# Patient Record
Sex: Female | Born: 1985 | Race: White | Hispanic: No | Marital: Married | Smoking: Never smoker
Health system: Southern US, Community
[De-identification: ages and names within clinical notes are randomized; demographics above are authoritative.]

## PROBLEM LIST (undated history)

## (undated) ENCOUNTER — Inpatient Hospital Stay (HOSPITAL_COMMUNITY): Payer: Self-pay

## (undated) DIAGNOSIS — I10 Essential (primary) hypertension: Secondary | ICD-10-CM

## (undated) DIAGNOSIS — R112 Nausea with vomiting, unspecified: Secondary | ICD-10-CM

## (undated) DIAGNOSIS — K219 Gastro-esophageal reflux disease without esophagitis: Secondary | ICD-10-CM

## (undated) DIAGNOSIS — Z87898 Personal history of other specified conditions: Secondary | ICD-10-CM

## (undated) DIAGNOSIS — Z87442 Personal history of urinary calculi: Secondary | ICD-10-CM

## (undated) DIAGNOSIS — Z8759 Personal history of other complications of pregnancy, childbirth and the puerperium: Secondary | ICD-10-CM

## (undated) DIAGNOSIS — Z9889 Other specified postprocedural states: Secondary | ICD-10-CM

## (undated) DIAGNOSIS — O165 Unspecified maternal hypertension, complicating the puerperium: Secondary | ICD-10-CM

## (undated) DIAGNOSIS — L309 Dermatitis, unspecified: Secondary | ICD-10-CM

## (undated) DIAGNOSIS — R51 Headache: Secondary | ICD-10-CM

## (undated) DIAGNOSIS — Z8659 Personal history of other mental and behavioral disorders: Secondary | ICD-10-CM

## (undated) DIAGNOSIS — R319 Hematuria, unspecified: Secondary | ICD-10-CM

## (undated) DIAGNOSIS — F419 Anxiety disorder, unspecified: Secondary | ICD-10-CM

## (undated) HISTORY — DX: Anxiety disorder, unspecified: F41.9

## (undated) HISTORY — DX: Headache: R51

---
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
## 2005-01-16 ENCOUNTER — Other Ambulatory Visit: Admission: RE | Admit: 2005-01-16 | Discharge: 2005-01-16 | Payer: Self-pay | Admitting: Obstetrics and Gynecology

## 2006-06-01 ENCOUNTER — Ambulatory Visit: Payer: Self-pay

## 2006-06-01 ENCOUNTER — Emergency Department (HOSPITAL_COMMUNITY): Admission: EM | Admit: 2006-06-01 | Discharge: 2006-06-01 | Payer: Self-pay | Admitting: Family Medicine

## 2006-06-01 ENCOUNTER — Encounter: Payer: Self-pay | Admitting: Cardiology

## 2006-06-01 ENCOUNTER — Ambulatory Visit: Payer: Self-pay | Admitting: *Deleted

## 2006-06-16 ENCOUNTER — Ambulatory Visit: Payer: Self-pay | Admitting: Internal Medicine

## 2006-07-27 ENCOUNTER — Ambulatory Visit: Payer: Self-pay | Admitting: Internal Medicine

## 2010-05-11 NOTE — L&D Delivery Note (Signed)
Delivery Note At 6:42 PM a healthy female was delivered via Vaginal, Spontaneous Delivery (Presentation: Right Occiput Anterior).  APGAR: 9, 9; weight 7 lb 0.4 oz (3185 g).   Placenta status: Intact, Spontaneous.  Cord: 3 vessels with the following complications: Cord blood collected for donation  Anesthesia: Epidural  Episiotomy: None Lacerations: 1st degree Suture Repair: 3.0 vicryl rapide Est. Blood Loss (mL): 350cc  Mom to postpartum.  Baby to nursery-stable.  Oliver Pila 02/24/2011, 7:57 PM

## 2010-08-07 LAB — HIV ANTIBODY (ROUTINE TESTING W REFLEX): HIV: NONREACTIVE

## 2010-08-07 LAB — ABO/RH: RH Type: POSITIVE

## 2010-08-07 LAB — HEPATITIS B SURFACE ANTIGEN: Hepatitis B Surface Ag: NEGATIVE

## 2010-09-05 DIAGNOSIS — R3129 Other microscopic hematuria: Secondary | ICD-10-CM

## 2010-09-26 NOTE — Assessment & Plan Note (Signed)
[name]                            CARDIOLOGY OFFICE NOTE   Dawn Meadows                      MRN:          [id number]  DATE:06/16/2006                            DOB:          23-Feb-1986    ALLERGIES:  1. PENICILLIN.  2. CODEINE.   PRESENTING CIRCUMSTANCES:  I am here because I feel my heart racing.   BRIEF HISTORY:  [name] is a 25 year old single female.  She  has no prior cardiac history.  She has been seen in Urgent Care in mid  January for palpitations, and she was referred to the [facility].  She was seen in the office June 01, 2006.  An echocardiogram  was obtained that showed ejection fraction of 55% to 60%.  No left  ventricular wall motion abnormalities.  Trivial mitral regurgitation.  Trivial tricuspid regurgitation.   The patient has been seen by medical practitioners for what are termed  panic attacks, and she has been placed on Effexor for these.  The panic  attacks are characterized by flushing, diaphoresis, nausea, vomiting,  and heart racing.  Taking Effexor has not really affected the frequency  and duration of these attacks.  She also says that she will have heart  racing when she is just sitting watching television.  They last 1 to 2  minutes.  She will wake up with her heart racing, flushed and nauseated.  On the January 22nd visit at [facility] she was given an event  monitor to wear.  Isolated strips have been seen in the office today,  and show only sinus tachycardia.  They are not prolonged enough to show  [place] onset of these dysrhythmias.  So, at this time it possible to  diagnose these episodes of palpitations accompanied by feeling of panic,  diaphoresis, flushing, nausea and vomiting as a dysrhythmia.  Laboratory  studies were also obtained, and show that the thyroid stimulating  hormone level is 0.876.  The free T4 is 1.10.  Both of these are within  normal limits.  Her other  laboratory, which included a basic metabolic  panel, CBC and urinalysis are all within normal limits.  The urinalysis  is negative.   She was questioned quite thoroughly by Dr. Graciela Husbands after orthostatic blood  pressure measurements were taken in the office today.  Over a period of  5 minutes her heart rate from a lying position of 85 beats per minute  had accelerated after standing 5 minutes to 97 beats per minute.  Blood  pressure was essentially unchanged.  Lying it was 125/77, standing it  was 130/95.  Dr. Odessa Fleming line of questioning was then directed at  possible POTS syndrome in this 25 year old female.   The patient denies excessive caffeine, use of drugs or alcohol.  She is  a nonsmoker.  She does at times get dizzy with these spells, and  sometimes feels as if she will pass out if they continue.  From the  studies, it shows that she has orthostatic intolerance.  Upon  questioning, the patient is very sensitive  to heat, often sometimes  running to put her head inside cold refrigerator.  She always has the  air conditioner on in the car with the windows up.  She is definitely  intolerant of heat when others are quite comfortable.  She also feels  her heart racing when she climbs stairs.  She has no prior history of  syncope.  After these episodes, she does feel fatigued.  She is unable  to correlate these with her periods, which she says are heavy on a  monthly regular basis.  Patient is currently on birth control  medication.   There is possibility that this patient is also having a concurrent  history of panic attacks if the diagnosis of postural orthostatic  tachycardia syndrome does not prove to be the pivotal diagnosis, then  more attention could be placed on the panic attack syndrome.   She will be given the monitor to wear, programmed in such a way that it  will capture the whole of an episode including the prodrome so that we  can get an idea whether this is a  dysrhythmia.  Patient has also  mentioned that her urine is fairly densely yellow, which prompted Dr.  Graciela Husbands to recommend that the patient increase fluid intake and to  increase salt intake, as this would be consistent with hypovolemic  state, which would aggravate her POTS.  Dr. Graciela Husbands also forwarded a  series of questions aimed at eliciting whether the patient feels  claustrophobic with these episodes, and the answer seems to be yes.   He brought forth the following foregoing plans:  The patient will wear the event monitor to catch a whole event from  start to finish.  Patient is encouraged to increase fluid intake as well  as salt and electrolyte intake.  There is the possibility that a tilt  table test would be more demonstrative of her symptoms, and this could  be reserved for the future.  The patient will return in 4 weeks for  office visit.  We are awaiting evidence from the newly reprogrammed  monitor, and we are hoping that simple measures such as increasing fluid  and salt will help her symptoms.  Dr. Graciela Husbands has also volunteered to  refer her to Dr. Marinda Elk in [facility], a practitioner who is  skilled at treating panic disorders.  Patient currently takes Effexor  37.5 mg daily and Ortho-Tri-Cyclen birth control on a daily basis.  Once  again, the patient will return in 4 weeks.   Physical exam included an alert and oriented young female, in no acute  distress, answering appropriately.  LUNGS:  Clear to auscultation bilaterally.  HEART:  Is in regular rate and rhythm without murmur.  ABDOMEN:  Soft and non-distended.  Bowel sounds are present.  Abdominal  aorta is not palpated.  Radial pulses are 4/4 bilaterally as are the dorsalis pedis pulses 4/4.   Once again, the plan is for the patient to report back in 4 weeks.  We  will review the monitor results.  We will also reserve tilt table for  more definitive diagnosis if necessary.    Maple Mirza, PA   Electronically Signed      Duke Salvia, MD, [facility]  Electronically Signed   GM/MedQ  DD: 06/16/2006  DT: 06/16/2006  Job #: [id number]

## 2010-09-26 NOTE — Assessment & Plan Note (Signed)
[facility]                            CARDIOLOGY OFFICE NOTE   Dawn Meadows, Dawn Meadows                      MRN:          [id number]  DATE:06/01/2006                            DOB:          [birth year]    CHIEF COMPLAINT:  Palpitations.   HISTORY OF PRESENT ILLNESS:  This is a 25 year old single white female  patient with no prior cardiac history.  She went over to urgent care  today because of palpitations and she was sent here to see [place].  The  patient has a history of panic attack, which she takes Effexor for, but  this has been well controlled, and she says these are very different  from her panic attacks.  She awakened this morning at 4 a.m. with her  heart racing and she became dizzy and nauseated with it.  The total  episode lasted about 2 hours before it would ease, but it continues to  come back.  She has had similar episodes, once while driving down  [address], and she had to pull over and she vomited.  She then  felt better and went on her way.  She can relate 2 other episodes that  she was not quite as sick, and they were shorter lived.  She has no  history of heart murmur, rheumatic heart disease, prior history of  palpitations, chest pain.  She exercises on a regular basis.  She does  not drink excessive caffeine, use or abuse drugs or alcohol, and is a  nonsmoker.  She has no family history of heart disease.   ALLERGIES:  No known drug allergies.   CURRENT MEDICATIONS:  1. Effexor 37.5 mg daily.  2. Ortho Tri-Cyclen birth control that she has taken since she was 25      years old.   PAST MEDICAL HISTORY:  Significant for panic attacks.  No other  significant past medical history.   SOCIAL HISTORY:  She is single.  She works as a Musician.  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  She is a nonsmoker.  Denies drugs or alcohol use or abuse.   FAMILY HISTORY:  Negative for coronary artery disease.   REVIEW  OF SYSTEMS:  Significant for dizziness with her palpitations and  nausea, and occasionally vomiting when she has her palpitations.  No  change in bowels, or melena.  Other review of systems negative.   PHYSICAL EXAM:  This is a young 25 year old white female in no acute  distress.  Blood pressure is 129/93, pulse 88, weight 151.  NECK:  Without JVD, eschar, bruit, or thyroid enlargement.  LUNGS:  Clear anterior, posterior, and lateral.  HEART:  Regular rate and rhythm at 88 beats per minute.  Normal S1, S2.  Question of a mid-systolic click and a 1/6 systolic murmur at the apex.  ABDOMEN:  Soft without organomegaly, masses, lesions, or abnormal  tenderness.  EXTREMITIES:  Without cyanosis, clubbing, or edema.  She has good distal  pulses.   EKG:  Normal sinus rhythm.  Normal EKG.   IMPRESSION:  1. Palpitations with  dizziness, nausea, and occasional vomiting, rule      out arrhythmia.  2. Hypertension in the setting of palpitations.  3. History of panic attacks on Effexor.   PLAN:  At this time, we will check TSH, T4, T3, CMET, CBC, event  recorder, and 2D echo.  She will then followup with Dr. Excell Seltzer next week  to establish with a cardiologist and follow up on these tests.      Jacolyn Reedy, PA-C  Electronically Signed      Cecil Cranker, MD, [facility]  Electronically Signed   ML/MedQ  DD: 06/01/2006  DT: 06/01/2006  Job #: [id number]

## 2010-09-26 NOTE — Assessment & Plan Note (Signed)
[place] [facility]                         ELECTROPHYSIOLOGY OFFICE NOTE   ANNALAURA, SAUSEDA                      MRN:          [id number]  DATE:07/27/2006                            DOB:          April 03, 1986    Drucie Opitz was seen a couple of months ago for symptoms consistent  with __________. We increased her salt and water, her symptoms are much  improved. Her blood pressure was 127/80 today, her pulse was 81,  orthostatics were not taken because her symptoms were so minimal.   I did tell her that I would call Dr. Marinda Elk to try and get her in  with somebody who is good at medical therapy for anxiety, etc., and I  plan to do that.     Duke Salvia, MD, [facility]  Electronically Signed    SCK/MedQ  DD: 07/28/2006  DT: 07/29/2006  Job #: [id number]   cc:   Molly Maduro L. Foy [place], M.D.

## 2010-10-03 ENCOUNTER — Other Ambulatory Visit (HOSPITAL_COMMUNITY): Payer: Self-pay | Admitting: Obstetrics and Gynecology

## 2010-10-03 DIAGNOSIS — Z3689 Encounter for other specified antenatal screening: Secondary | ICD-10-CM

## 2010-10-07 ENCOUNTER — Ambulatory Visit (HOSPITAL_COMMUNITY)
Admission: RE | Admit: 2010-10-07 | Discharge: 2010-10-07 | Disposition: A | Discharge status: Home / Self Care | Payer: BC Managed Care – PPO | Source: Ambulatory Visit | Attending: Obstetrics and Gynecology | Admitting: Obstetrics and Gynecology

## 2010-10-07 DIAGNOSIS — Z3689 Encounter for other specified antenatal screening: Secondary | ICD-10-CM

## 2010-10-07 DIAGNOSIS — O358XX0 Maternal care for other (suspected) fetal abnormality and damage, not applicable or unspecified: Secondary | ICD-10-CM | POA: Insufficient documentation

## 2010-10-07 DIAGNOSIS — Z363 Encounter for antenatal screening for malformations: Secondary | ICD-10-CM | POA: Insufficient documentation

## 2010-10-07 DIAGNOSIS — Z1389 Encounter for screening for other disorder: Secondary | ICD-10-CM | POA: Insufficient documentation

## 2011-02-04 LAB — STREP B DNA PROBE: GBS: NEGATIVE

## 2011-02-17 ENCOUNTER — Inpatient Hospital Stay (HOSPITAL_COMMUNITY): Payer: BC Managed Care – PPO

## 2011-02-17 ENCOUNTER — Encounter (HOSPITAL_COMMUNITY): Payer: Self-pay | Admitting: *Deleted

## 2011-02-17 ENCOUNTER — Inpatient Hospital Stay (HOSPITAL_COMMUNITY)
Admission: AD | Admit: 2011-02-17 | Discharge: 2011-02-17 | Disposition: A | Discharge status: Home / Self Care | Payer: BC Managed Care – PPO | Source: Ambulatory Visit | Attending: Obstetrics and Gynecology | Admitting: Obstetrics and Gynecology

## 2011-02-17 DIAGNOSIS — O479 False labor, unspecified: Secondary | ICD-10-CM | POA: Insufficient documentation

## 2011-02-17 DIAGNOSIS — R3129 Other microscopic hematuria: Secondary | ICD-10-CM

## 2011-02-17 DIAGNOSIS — O4100X0 Oligohydramnios, unspecified trimester, not applicable or unspecified: Secondary | ICD-10-CM | POA: Insufficient documentation

## 2011-02-17 LAB — URINE MICROSCOPIC-ADD ON

## 2011-02-17 LAB — URINALYSIS, ROUTINE W REFLEX MICROSCOPIC
Bilirubin Urine: NEGATIVE
Ketones, ur: 15 mg/dL — AB
Specific Gravity, Urine: 1.025 (ref 1.005–1.030)
Urobilinogen, UA: 0.2 mg/dL (ref 0.0–1.0)

## 2011-02-17 NOTE — Progress Notes (Signed)
Pt G1 at 38.1wks, having contractions and left side back pain that radiates to the front.  Pt reports oligo,.

## 2011-02-17 NOTE — ED Provider Notes (Signed)
Tavi Barnett25 y.o.G1P041w1d Chief Complaint  Patient presents with  . Contractions  . Back Pain    SUBJECTIVE  HPI: Woke today at 0130 with constant sharp left flank pain radiating to left side. Also having mild contractions felt in lower abdomen. 6 days ago had blood-tinged vaginal nucleus and what she thought was a spurt of fluid. The next day she was evaluated for SROM in the office and was felt to be intact but had oligo of about 5. Since then she's had a couple of other small spurts, but thinks this is vaginal discharge. Her ultrasound showed good fetal growth. She's never had similar pain or history of kidney stone. She denies dysuria or gross hematuria. No fever, chills, nausea vomiting.  Past Medical History  Diagnosis Date  . No pertinent past medical history    Ob Hx: Gyn Hx: Past Surgical History  Procedure Date  . Wisdom tooth extraction    History   Social History  . Marital Status: Married    Spouse Name: N/A    Number of Children: N/A  . Years of Education: N/A   Occupational History  . Not on file.   Social History Main Topics  . Smoking status: Never Smoker   . Smokeless tobacco: Not on file  . Alcohol Use: No  . Drug Use: No  . Sexually Active: Yes   Other Topics Concern  . Not on file   Social History Narrative  . No narrative on file   No current facility-administered medications on file prior to encounter.   No current outpatient prescriptions on file prior to encounter.   Allergies  Allergen Reactions  . Codeine Nausea And Vomiting  . Penicillins Nausea And Vomiting    ROS: pertinent items in HPI  OBJECTIVE  BP 136/83  Pulse 111  Temp(Src) 98.3 F (36.8 C) (Oral)  Resp 18  Ht 5\' 6"  (1.676 m)  Wt 87.091 kg (192 lb)  BMI 30.99 kg/m2   Physical Exam:   General: WN/WD sitting up in bed and rubbing left flank. Looks fairly comfortable.  Abd: Soft nontender, term size Cervix: Posterior soft, 1/30/-2 vertex Fetal heart rate  125 to 130s baseline, moderate variability, reactive, isolated mild variable x1 Toco: rare mild UC  Results for orders placed during the hospital encounter of 02/17/11 (from the past 24 hour(s))  URINALYSIS, ROUTINE W REFLEX MICROSCOPIC     Status: Abnormal   Collection Time   02/17/11  7:28 AM      Component Value Range   Color, Urine YELLOW  YELLOW    Appearance CLEAR  CLEAR    Specific Gravity, Urine 1.025  1.005 - 1.030    pH 6.0  5.0 - 8.0    Glucose, UA NEGATIVE  NEGATIVE (mg/dL)   Hgb urine dipstick LARGE (*) NEGATIVE    Bilirubin Urine NEGATIVE  NEGATIVE    Ketones, ur 15 (*) NEGATIVE (mg/dL)   Protein, ur NEGATIVE  NEGATIVE (mg/dL)   Urobilinogen, UA 0.2  0.0 - 1.0 (mg/dL)   Nitrite NEGATIVE  NEGATIVE    Leukocytes, UA NEGATIVE  NEGATIVE   URINE MICROSCOPIC-ADD ON     Status: Normal   Collection Time   02/17/11  7:28 AM      Component Value Range   Squamous Epithelial / LPF RARE  RARE    RBC / HPF TOO NUMEROUS TO COUNT  <3 (RBC/hpf)   AFI 10.9  MAU course: the patient took to regular strength Tylenol as of her  and on MAU and had some relief from her pain. Pre-discharge she is sitting calmly in NAD, smiling. ASSESSMENT  Primigravida at 38 weeks 1 day Fetal parameters are reassuring Hematuria, possible stone Oligohydramnios resolved   PLAN Will send urine for C&S and  Discussed with Dr. Ellyn Hack. Offered patient something stronger than Tylenol for breakthrough pain however she declines. Discussed dosing Tylenol. She will call the office regarding today's prenatal visit and whether she should just keep the Friday prenatal visit with Dr. Senaida Ores.

## 2011-02-18 LAB — URINE CULTURE: Colony Count: NO GROWTH

## 2011-02-20 ENCOUNTER — Telehealth (HOSPITAL_COMMUNITY): Payer: Self-pay | Admitting: *Deleted

## 2011-02-20 ENCOUNTER — Encounter (HOSPITAL_COMMUNITY): Payer: Self-pay | Admitting: *Deleted

## 2011-02-20 NOTE — Telephone Encounter (Signed)
Preadmission screen  

## 2011-02-23 ENCOUNTER — Other Ambulatory Visit: Payer: Self-pay | Admitting: Obstetrics and Gynecology

## 2011-02-23 ENCOUNTER — Encounter (HOSPITAL_COMMUNITY): Payer: Self-pay | Admitting: *Deleted

## 2011-02-24 ENCOUNTER — Encounter (HOSPITAL_COMMUNITY): Payer: Self-pay | Admitting: Anesthesiology

## 2011-02-24 ENCOUNTER — Inpatient Hospital Stay (HOSPITAL_COMMUNITY): Payer: BC Managed Care – PPO | Admitting: Anesthesiology

## 2011-02-24 ENCOUNTER — Inpatient Hospital Stay (HOSPITAL_COMMUNITY)
Admission: RE | Admit: 2011-02-24 | Discharge: 2011-02-26 | DRG: 373 | Disposition: A | Discharge status: Home / Self Care | Payer: BC Managed Care – PPO | Source: Ambulatory Visit | Attending: Obstetrics and Gynecology | Admitting: Obstetrics and Gynecology

## 2011-02-24 ENCOUNTER — Encounter (HOSPITAL_COMMUNITY): Payer: Self-pay

## 2011-02-24 LAB — COMPREHENSIVE METABOLIC PANEL
Albumin: 2.8 g/dL — ABNORMAL LOW (ref 3.5–5.2)
Alkaline Phosphatase: 201 U/L — ABNORMAL HIGH (ref 39–117)
BUN: 11 mg/dL (ref 6–23)
CO2: 24 mEq/L (ref 19–32)
Chloride: 102 mEq/L (ref 96–112)
GFR calc non Af Amer: >90 mL/min (ref >90)
Glucose, Bld: 104 mg/dL — ABNORMAL HIGH (ref 70–99)
Potassium: 3.4 mEq/L — ABNORMAL LOW (ref 3.5–5.1)
Total Bilirubin: 0.3 mg/dL (ref 0.3–1.2)

## 2011-02-24 LAB — CBC
HCT: 31 % — ABNORMAL LOW (ref 36.0–46.0)
Hemoglobin: 10.2 g/dL — ABNORMAL LOW (ref 12.0–15.0)
MCH: 30.5 pg (ref 26.0–34.0)
MCV: 92.8 fL (ref 78.0–100.0)
RBC: 3.34 MIL/uL — ABNORMAL LOW (ref 3.87–5.11)
WBC: 8.3 10*3/uL (ref 4.0–10.5)

## 2011-02-24 LAB — URIC ACID: Uric Acid, Serum: 4.8 mg/dL (ref 2.4–7.0)

## 2011-02-24 LAB — URINALYSIS, ROUTINE W REFLEX MICROSCOPIC
Bilirubin Urine: NEGATIVE
Ketones, ur: 40 mg/dL — AB
Nitrite: NEGATIVE
Protein, ur: NEGATIVE mg/dL
pH: 7 (ref 5.0–8.0)

## 2011-02-24 MED ORDER — PRENATAL PLUS 27-1 MG PO TABS
1.0000 | ORAL_TABLET | Freq: Every day | ORAL | Status: DC
  Administered 2011-02-25 – 2011-02-26 (×2): 1 via ORAL
  Filled 2011-02-24 (×2): qty 1

## 2011-02-24 MED ORDER — DIPHENHYDRAMINE HCL 50 MG/ML IJ SOLN
12.5000 mg | INTRAMUSCULAR | Status: DC | PRN
  Administered 2011-02-24: 12.5 mg via INTRAVENOUS
  Filled 2011-02-24: qty 1

## 2011-02-24 MED ORDER — FENTANYL 2.5 MCG/ML BUPIVACAINE 1/10 % EPIDURAL INFUSION (WH - ANES)
INTRAMUSCULAR | Status: AC
  Administered 2011-02-24: 14 mL/h via EPIDURAL
  Filled 2011-02-24: qty 60

## 2011-02-24 MED ORDER — LACTATED RINGERS IV SOLN
INTRAVENOUS | Status: DC
  Administered 2011-02-24 (×2): via INTRAVENOUS

## 2011-02-24 MED ORDER — ZOLPIDEM TARTRATE 5 MG PO TABS: 5.0000 mg | ORAL_TABLET | Freq: Every evening | ORAL | Status: DC | PRN

## 2011-02-24 MED ORDER — PHENYLEPHRINE 40 MCG/ML (10ML) SYRINGE FOR IV PUSH (FOR BLOOD PRESSURE SUPPORT)
PREFILLED_SYRINGE | INTRAVENOUS | Status: AC
  Filled 2011-02-24: qty 5

## 2011-02-24 MED ORDER — OXYCODONE-ACETAMINOPHEN 5-325 MG PO TABS: 1.0000 | ORAL_TABLET | ORAL | Status: DC | PRN

## 2011-02-24 MED ORDER — OXYCODONE-ACETAMINOPHEN 5-325 MG PO TABS: 2.0000 | ORAL_TABLET | ORAL | Status: DC | PRN

## 2011-02-24 MED ORDER — IBUPROFEN 600 MG PO TABS
600.0000 mg | ORAL_TABLET | Freq: Four times a day (QID) | ORAL | Status: DC | PRN
  Filled 2011-02-24: qty 1

## 2011-02-24 MED ORDER — OXYTOCIN 20 UNITS IN LACTATED RINGERS INFUSION - SIMPLE
1.0000 m[IU]/min | INTRAVENOUS | Status: DC
  Administered 2011-02-24: 2 m[IU]/min via INTRAVENOUS
  Filled 2011-02-24: qty 1000

## 2011-02-24 MED ORDER — FENTANYL 2.5 MCG/ML BUPIVACAINE 1/10 % EPIDURAL INFUSION (WH - ANES)
INTRAMUSCULAR | Status: DC | PRN
  Administered 2011-02-24: 14 mL/h via EPIDURAL
  Administered 2011-02-24: 14 mL/h

## 2011-02-24 MED ORDER — ACETAMINOPHEN 325 MG PO TABS
650.0000 mg | ORAL_TABLET | ORAL | Status: DC | PRN
  Administered 2011-02-24: 650 mg via ORAL
  Filled 2011-02-24: qty 2

## 2011-02-24 MED ORDER — ONDANSETRON HCL 4 MG PO TABS: 4.0000 mg | ORAL_TABLET | ORAL | Status: DC | PRN

## 2011-02-24 MED ORDER — LIDOCAINE HCL (PF) 1 % IJ SOLN
30.0000 mL | INTRAMUSCULAR | Status: DC | PRN
  Filled 2011-02-24 (×2): qty 30

## 2011-02-24 MED ORDER — DIBUCAINE 1 % RE OINT: 1.0000 "application " | TOPICAL_OINTMENT | RECTAL | Status: DC | PRN

## 2011-02-24 MED ORDER — SENNOSIDES-DOCUSATE SODIUM 8.6-50 MG PO TABS: 2.0000 | ORAL_TABLET | Freq: Every day | ORAL | Status: DC

## 2011-02-24 MED ORDER — LANOLIN HYDROUS EX OINT: TOPICAL_OINTMENT | CUTANEOUS | Status: DC | PRN

## 2011-02-24 MED ORDER — BENZOCAINE-MENTHOL 20-0.5 % EX AERO: 1.0000 "application " | INHALATION_SPRAY | CUTANEOUS | Status: DC | PRN

## 2011-02-24 MED ORDER — FLEET ENEMA 7-19 GM/118ML RE ENEM: 1.0000 | ENEMA | RECTAL | Status: DC | PRN

## 2011-02-24 MED ORDER — EPHEDRINE 5 MG/ML INJ
INTRAVENOUS | Status: AC
  Filled 2011-02-24: qty 4

## 2011-02-24 MED ORDER — FENTANYL 2.5 MCG/ML BUPIVACAINE 1/10 % EPIDURAL INFUSION (WH - ANES)
INTRAMUSCULAR | Status: AC
  Filled 2011-02-24: qty 60

## 2011-02-24 MED ORDER — TERBUTALINE SULFATE 1 MG/ML IJ SOLN: 0.2500 mg | Freq: Once | INTRAMUSCULAR | Status: DC | PRN

## 2011-02-24 MED ORDER — WITCH HAZEL-GLYCERIN EX PADS: 1.0000 "application " | MEDICATED_PAD | CUTANEOUS | Status: DC | PRN

## 2011-02-24 MED ORDER — TETANUS-DIPHTH-ACELL PERTUSSIS 5-2.5-18.5 LF-MCG/0.5 IM SUSP
0.5000 mL | Freq: Once | INTRAMUSCULAR | Status: AC
  Administered 2011-02-25: 0.5 mL via INTRAMUSCULAR
  Filled 2011-02-24: qty 0.5

## 2011-02-24 MED ORDER — OXYTOCIN BOLUS FROM INFUSION
500.0000 mL | Freq: Once | INTRAVENOUS | Status: DC
  Filled 2011-02-24: qty 500

## 2011-02-24 MED ORDER — CITRIC ACID-SODIUM CITRATE 334-500 MG/5ML PO SOLN: 30.0000 mL | ORAL | Status: DC | PRN

## 2011-02-24 MED ORDER — LACTATED RINGERS IV SOLN
500.0000 mL | INTRAVENOUS | Status: DC | PRN
  Administered 2011-02-24: 1000 mL via INTRAVENOUS

## 2011-02-24 MED ORDER — OXYTOCIN 20 UNITS IN LACTATED RINGERS INFUSION - SIMPLE
125.0000 mL/h | Freq: Once | INTRAVENOUS | Status: AC
  Administered 2011-02-24: 999 mL/h via INTRAVENOUS

## 2011-02-24 MED ORDER — LIDOCAINE HCL 1.5 % IJ SOLN
INTRAMUSCULAR | Status: DC | PRN
  Administered 2011-02-24 (×2): 5 mL via INTRADERMAL

## 2011-02-24 MED ORDER — ONDANSETRON HCL 4 MG/2ML IJ SOLN: 4.0000 mg | Freq: Four times a day (QID) | INTRAMUSCULAR | Status: DC | PRN

## 2011-02-24 MED ORDER — IBUPROFEN 600 MG PO TABS
600.0000 mg | ORAL_TABLET | Freq: Four times a day (QID) | ORAL | Status: DC
  Administered 2011-02-24 – 2011-02-26 (×6): 600 mg via ORAL
  Filled 2011-02-24 (×6): qty 1

## 2011-02-24 MED ORDER — SIMETHICONE 80 MG PO CHEW
80.0000 mg | CHEWABLE_TABLET | ORAL | Status: DC | PRN
Start: 2011-02-24 — End: 2011-02-26

## 2011-02-24 MED ORDER — DIPHENHYDRAMINE HCL 25 MG PO CAPS: 25.0000 mg | ORAL_CAPSULE | Freq: Four times a day (QID) | ORAL | Status: DC | PRN

## 2011-02-24 MED ORDER — ONDANSETRON HCL 4 MG/2ML IJ SOLN: 4.0000 mg | INTRAMUSCULAR | Status: DC | PRN

## 2011-02-24 NOTE — Progress Notes (Signed)
   Subjective: Comfortable with epidural  Objective: BP 155/100  Pulse 113  Temp(Src) 98.1 F (36.7 C) (Oral)  Resp 20  Ht 5\' 6"  (1.676 m)  Wt 84.823 kg (187 lb)  BMI 30.18 kg/m2  SpO2 100%      FHT:  FHR: 130 bpm, variability: moderate,  accelerations:  Present,  decelerations:  Absent UC:   regular, every 2-3 minutes SVE:  3/70/-1 IUPC placed Labs: Lab Results  Component Value Date   WBC 8.3 02/24/2011   HGB 10.2* 02/24/2011   HCT 31.0* 02/24/2011   MCV 92.8 02/24/2011   PLT 227 02/24/2011    Assessment / Plan: Follow progress  Danta Baumgardner W 02/24/2011, 12:46 PM

## 2011-02-24 NOTE — Anesthesia Preprocedure Evaluation (Signed)
Anesthesia Evaluation  Name, MR# and DOB Patient awake  General Assessment Comment  Reviewed: Allergy & Precautions, H&P , Patient's Chart, lab work & pertinent test results  Airway Mallampati: II TM Distance: >3 FB Neck ROM: full    Dental No notable dental hx.    Pulmonary  clear to auscultation  Pulmonary exam normal       Cardiovascular regular Normal    Neuro/Psych Negative Neurological ROS  Negative Psych ROS   GI/Hepatic negative GI ROS Neg liver ROS    Endo/Other  Negative Endocrine ROS  Renal/GU negative Renal ROS     Musculoskeletal   Abdominal   Peds  Hematology negative hematology ROS (+)   Anesthesia Other Findings   Reproductive/Obstetrics (+) Pregnancy                           Anesthesia Physical Anesthesia Plan  ASA: II  Anesthesia Plan: Epidural   Post-op Pain Management:    Induction:   Airway Management Planned:   Additional Equipment:   Intra-op Plan:   Post-operative Plan:   Informed Consent: I have reviewed the patients History and Physical, chart, labs and discussed the procedure including the risks, benefits and alternatives for the proposed anesthesia with the patient or authorized representative who has indicated his/her understanding and acceptance.     Plan Discussed with:   Anesthesia Plan Comments:         Anesthesia Quick Evaluation  

## 2011-02-24 NOTE — H&P (Signed)
Dawn Meadows is a 25 y.o. female G1P0 at 58 1/7 weeks presenting for induction for elevated BP readings in office of 150-160/90-100.  SHe is also favorable aat 39+ weeks.  Her prenatal care is complicated by the labile blood pressures, but she has had no proteinuria and labs and NST's are WNL.  She has had issues with back and pelvic pain and was thought to have passed a kidney stone last week.  She measured size less than dates and a US showed growth at 34%ile with an AFI of 5+.  Repeat US showed improvement with an AFI of 10.  She currently has no significant symptoms.  History OB History    Grav Para Term Preterm Abortions TAB SAB Ect Mult Living   1 0 0 0 0 0 0 0 0 0      Past Medical History  Diagnosis Date  . No pertinent past medical history   . Anxiety     was on zoloft and effexor til 12/2010   Past Surgical History  Procedure Date  . Wisdom tooth extraction    Family History: family history includes Cancer in her maternal grandmother.  There is no history of Anesthesia problems, and Hypotension, and Malignant hyperthermia, and Pseudochol deficiency, . Social History:  reports that she has never smoked. She has never used smokeless tobacco. She reports that she does not drink alcohol or use illicit drugs.  ROS    Blood pressure 140/92, pulse 116, temperature 98.2 F (36.8 C), temperature source Oral, resp. rate 20, height 5\' 6"  (1.676 m), weight 84.823 kg (187 lb). Maternal Exam:  Uterine Assessment: Contraction frequency is rare.   Abdomen: Patient reports no abdominal tenderness. Fetal presentation: vertex  Introitus: Normal vulva. Normal vagina.    Physical Exam  Constitutional: She appears well-developed.  Cardiovascular: Normal rate and regular rhythm.   Respiratory: Effort normal and breath sounds normal.  GI: Soft. Bowel sounds are normal.  Genitourinary: Vagina normal and uterus normal.  Neurological: She is alert.  Psychiatric: She has a normal mood and  affect.    Prenatal labs: ABO, Rh: O/Positive/-- (03/29 0000) Antibody: Negative (03/29 0000) Rubella: Immune (03/29 0000) RPR: Nonreactive (03/29 0000)  HBsAg: Negative (03/29 0000)  HIV: Non-reactive (03/29 0000)  GBS: Negative (09/26 0000)  First trimester screen negative AFP negative One hour glucola 107  Assessment/Plan: Pt started on pitocin.  Will AROM and follow progress.  WIll order PIH labs and check urine for protein.  Oliver Pila 02/24/2011, 9:06 AM

## 2011-02-24 NOTE — Anesthesia Procedure Notes (Signed)

## 2011-02-24 NOTE — Progress Notes (Signed)
Dawn Meadows is a 25 y.o. G1P0000 at [redacted]Meadows[redacted]d Subjective: Pt feels some pressure with ctx, but comfortable overall, just had cervical exam  Objective: BP 150/84  Pulse 105  Temp(Src) 98.4 F (36.9 C) (Oral)  Resp 20  Ht 5\' 6"  (1.676 m)  Wt 84.823 kg (187 lb)  BMI 30.18 kg/m2  SpO2 100%      FHT:  FHR: 140 bpm, variability: moderate,  accelerations:  Present,  decelerations:  Absent UC:   regular, every 2-3 minutes SVE:   Dilation: 6 Effacement (%): 80 Station: -1 Exam by:: m wilkins rnc  Labs: Lab Results  Component Value Date   WBC 8.3 02/24/2011   HGB 10.2* 02/24/2011   HCT 31.0* 02/24/2011   MCV 92.8 02/24/2011   PLT 227 02/24/2011    Assessment / Plan: Induction of labor due to gestational hypertension,  progressing well on pitocin  Dawn Meadows 02/24/2011, 5:34 PM

## 2011-02-24 NOTE — Progress Notes (Signed)
Dawn Meadows is a 25 y.o. G1P0000 at [redacted]w[redacted]d   Subjective: Pt requested and waiting on epidural  Objective: BP 139/84  Pulse 109  Temp(Src) 98.1 F (36.7 C) (Oral)  Resp 20  Ht 5\' 6"  (1.676 m)  Wt 84.823 kg (187 lb)  BMI 30.18 kg/m2      FHT:  FHR: 140 bpm, variability: moderate,  accelerations:  Present,  decelerations:  Present mild early decels with ctx UC:   regular, every 2 minutes SVE:   Dilation: 2.5 Effacement (%): 50 Station: -2 Exam by:: m wilkins rnc  Labs: Lab Results  Component Value Date   WBC 8.3 02/24/2011   HGB 10.2* 02/24/2011   HCT 31.0* 02/24/2011   MCV 92.8 02/24/2011   PLT 227 02/24/2011    Assessment / Plan: Induction of labor due to gestational hypertension,  progressing well on pitocin BP stable, PIH labs WNL.  Will check urine for protein after epidural, from foley.  Oliver Pila 02/24/2011, 12:26 PM

## 2011-02-24 NOTE — Progress Notes (Signed)
Dawn Meadows is a 25 y.o. G1P0000 at [redacted]w[redacted]d   Subjective: Pt feeling minimal ctx  Objective: BP 140/92  Pulse 116  Temp(Src) 98.2 F (36.8 C) (Oral)  Resp 20  Ht 5\' 6"  (1.676 m)  Wt 84.823 kg (187 lb)  BMI 30.18 kg/m2      FHT:  FHR: 135 bpm, variability: moderate,  accelerations:  Present,  decelerations:  Absent UC:   irregular, every 3-5 minutes SVE:   Dilation: 2.5 Effacement (%): 50 Station: -2 Exam by:: m wilkins rnc AROM clear  Labs: Lab Results  Component Value Date   WBC 8.3 02/24/2011   HGB 10.2* 02/24/2011   HCT 31.0* 02/24/2011   MCV 92.8 02/24/2011   PLT 227 02/24/2011    Assessment / Plan: Doing well, on pitocin.  Epidural as needed.  Dawn Meadows 02/24/2011, 9:24 AM

## 2011-02-25 LAB — CBC
HCT: 29.2 % — ABNORMAL LOW (ref 36.0–46.0)
Hemoglobin: 9.6 g/dL — ABNORMAL LOW (ref 12.0–15.0)
MCHC: 32.9 g/dL (ref 30.0–36.0)
RBC: 3.11 MIL/uL — ABNORMAL LOW (ref 3.87–5.11)
WBC: 13 10*3/uL — ABNORMAL HIGH (ref 4.0–10.5)

## 2011-02-25 NOTE — Progress Notes (Signed)
Post Partum Day 1 Subjective: no complaints, up ad lib and tolerating PO  Objective: Blood pressure 114/78, pulse 81, temperature 98.2 F (36.8 C), temperature source Oral, resp. rate 18, height 5\' 6"  (1.676 m), weight 84.823 kg (187 lb), SpO2 100.00%, unknown if currently breastfeeding.  Physical Exam:  General: alert Lochia: appropriate Uterine Fundus: firm   Basename 02/25/11 0515 02/24/11 0720  HGB 9.6* 10.2*  HCT 29.2* 31.0*    Assessment/Plan: Plan for discharge tomorrow BP WNL since delivery   LOS: 1 day   Benisha Hadaway W 02/25/2011, 8:59 AM

## 2011-02-25 NOTE — Anesthesia Postprocedure Evaluation (Signed)
Anesthesia Post Note  Patient: Dawn Meadows  Procedure(s) Performed: * No procedures listed *  Anesthesia type: Epidural  Patient location: Mother/Baby  Post pain: Pain level controlled  Post assessment: Post-op Vital signs reviewed  Last Vitals:  Filed Vitals:   02/25/11 0624  BP: 114/78  Pulse: 81  Temp: 98.2 F (36.8 C)  Resp: 18    Post vital signs: Reviewed  Level of consciousness: awake  Complications: No apparent anesthesia complications

## 2011-02-26 MED ORDER — IBUPROFEN 600 MG PO TABS: 600.0000 mg | ORAL_TABLET | Freq: Four times a day (QID) | ORAL | Status: AC

## 2011-02-26 NOTE — Progress Notes (Signed)
Post Partum Day 2 Subjective: no complaints, up ad lib and voiding  Objective: Blood pressure 135/86, pulse 64, temperature 97.7 F (36.5 C), temperature source Oral, resp. rate 20, height 5\' 6"  (1.676 m), weight 84.823 kg (187 lb), SpO2 98.00%, unknown if currently breastfeeding.  Physical Exam:  General: alert Lochia: appropriate Uterine Fundus: firm  Basename 02/25/11 0515 02/24/11 0720  HGB 9.6* 10.2*  HCT 29.2* 31.0*    Assessment/Plan: Discharge home Motrin F/u office 6 weeks   LOS: 2 days   Huy Majid W 02/26/2011, 8:52 AM

## 2011-02-26 NOTE — Progress Notes (Signed)
UR Chart review completed.  

## 2011-02-26 NOTE — Discharge Summary (Signed)
Obstetric Discharge Summary Reason for Admission: induction of labor Prenatal Procedures: none Intrapartum Procedures: spontaneous vaginal delivery Postpartum Procedures: none Complications-Operative and Postpartum: first degree perineal laceration Hemoglobin  Date Value Range Status  02/25/2011 9.6* 12.0-15.0 (g/dL) Final     HCT  Date Value Range Status  02/25/2011 29.2* 36.0-46.0 (%) Final    Discharge Diagnoses: Term Pregnancy-delivered  Discharge Information: Date: 02/26/2011 Activity: pelvic rest Diet: routine Medications: Ibuprofen Condition: stable Instructions: refer to practice specific booklet Discharge to: home Follow-up Information    Follow up with Dawn Meadows. Make an appointment in 6 weeks.   Contact information:   [address] [place] [address] [phone]          Newborn Data: Live born female  Birth Weight: 7 lb 0.4 oz (3185 g) APGAR: 9, 9  Home with mother.  Dawn Meadows 02/26/2011, 8:53 AM

## 2012-01-05 IMAGING — US US OB DETAIL+14 WK
1 series · 12 of 28 positions shown · non-contrast
Comparison: none

[Series 1: us ob detail +14 wk · 12 of 64 slices shown]
[im 3/64]
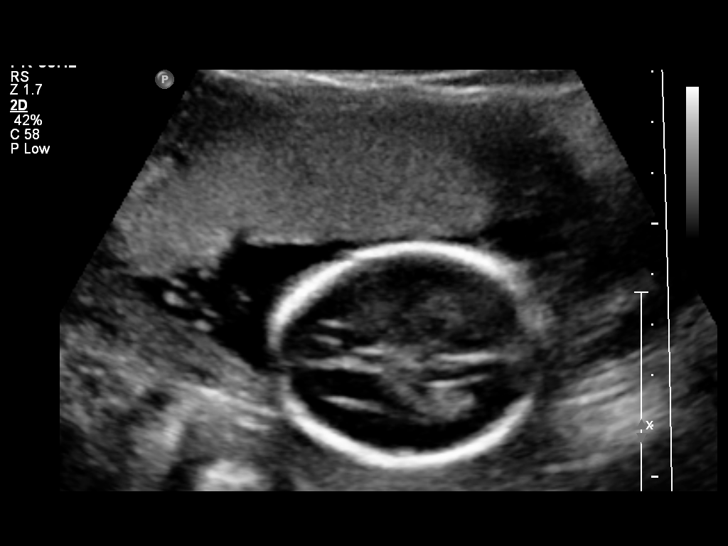
[im 8/64]
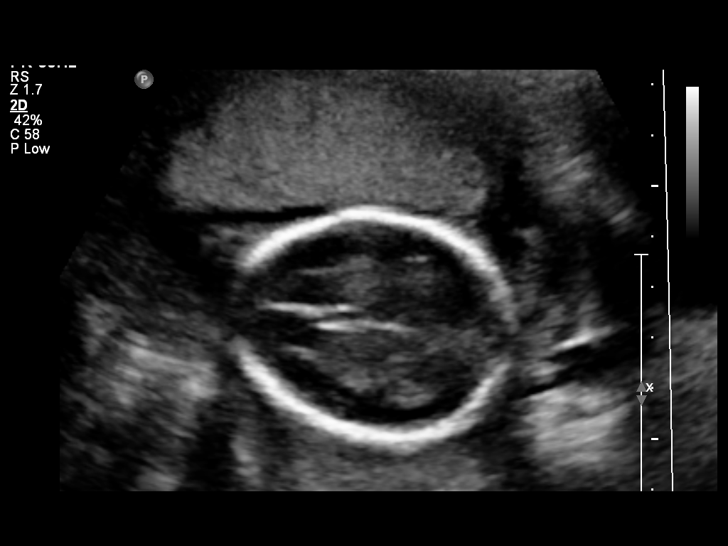
[im 12/64]
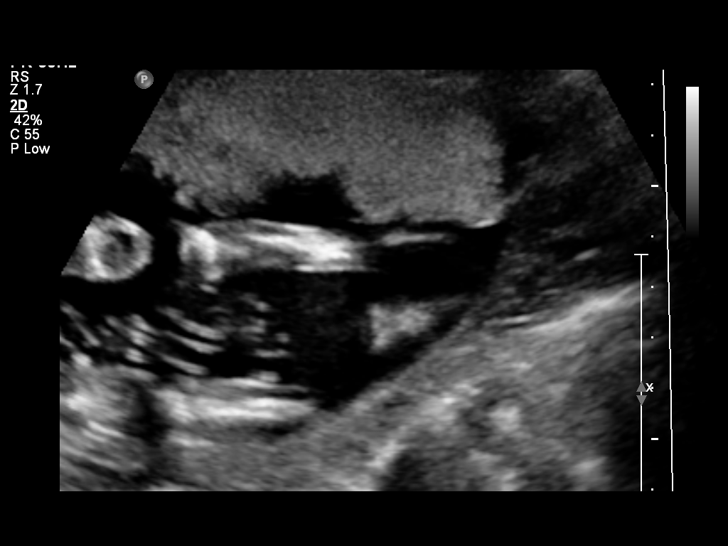
[im 19/64]
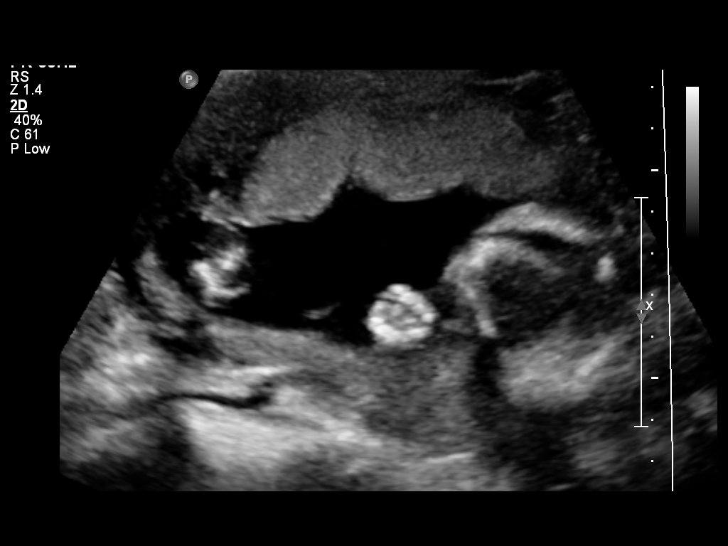
[im 24/64]
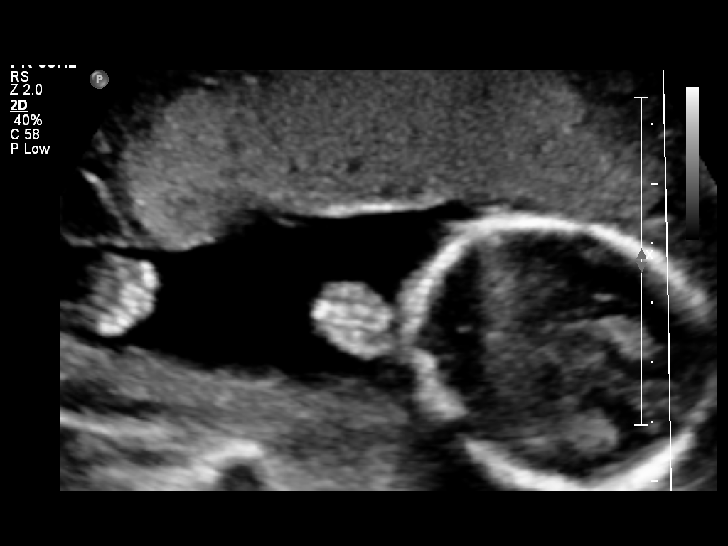
[im 29/64]
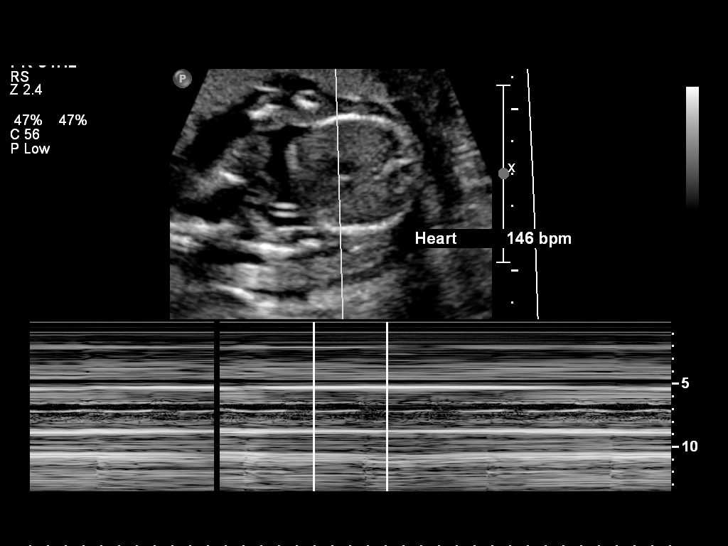
[im 36/64]
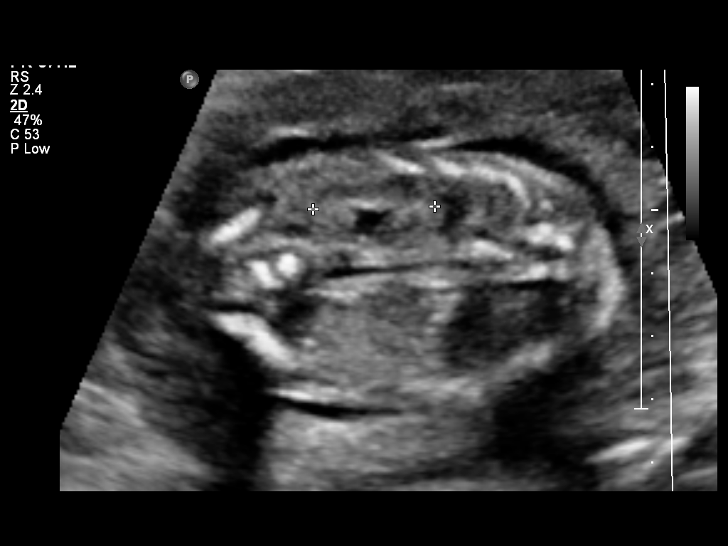
[im 40/64]
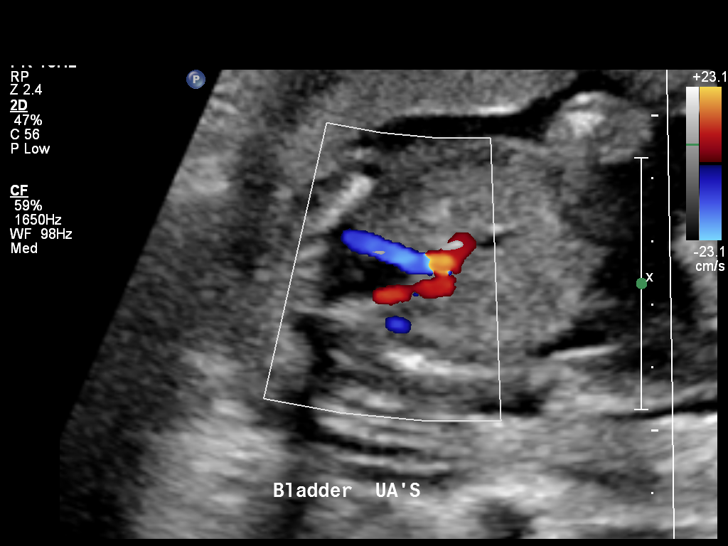
[im 45/64]
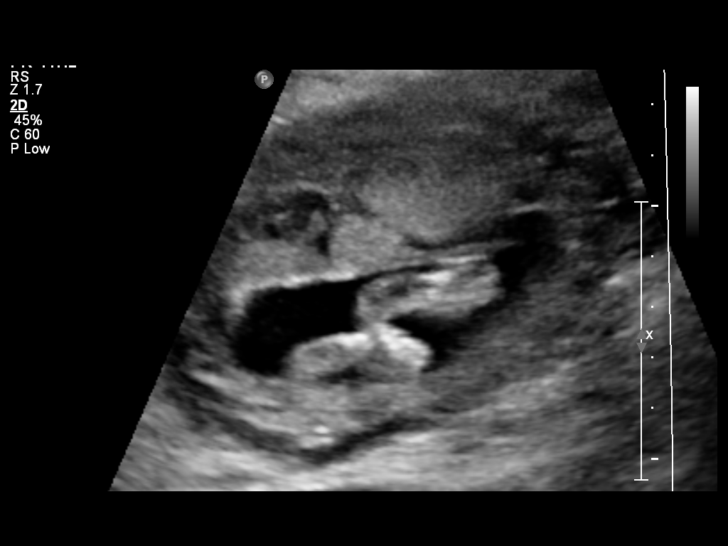
[im 52/64]
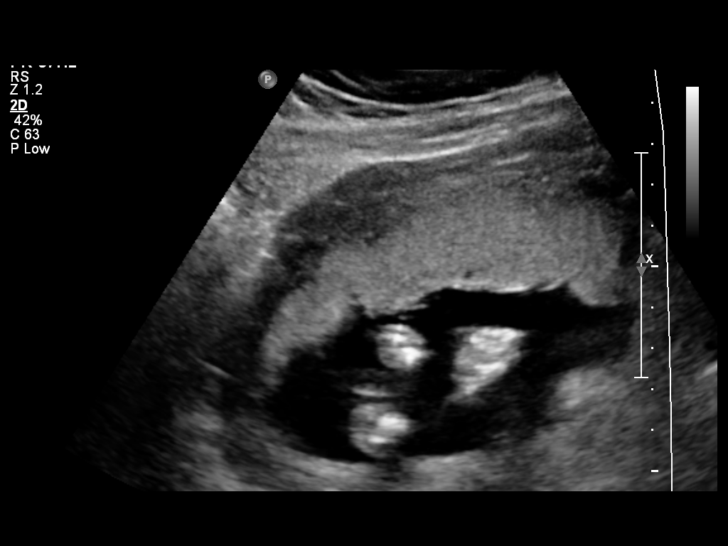
[im 57/64]
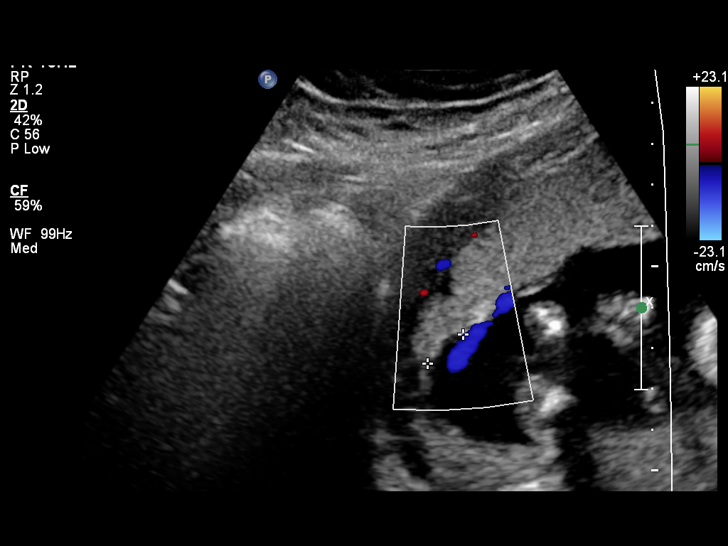
[im 61/64]
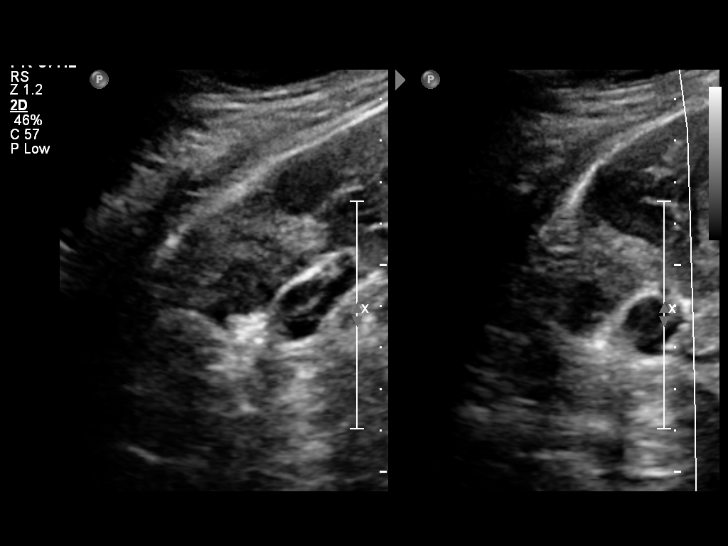

[12 of 28 positions shown; findings below may reference images not displayed]

OBSTETRICS REPORT
                      (Signed Final 10/07/2010 [DATE])

 Order#:         [id number]
Procedures

 US OB DETAIL + 14 WK                                  76811.0
Indications

 Detailed fetal anatomic survey
Fetal Evaluation

 Fetal Heart Rate:  146                          bpm
 Cardiac Activity:  Observed
 Presentation:      Cephalic
 Placenta:          Anterior, above cervical os
 P. Cord            Marginal insertion, 1.1 cm
 Insertion:

 Amniotic Fluid
 AFI FV:      Subjectively within normal limits
                                             Larg Pckt:     3.5  cm
Biometry

 BPD:     43.6  mm     G. Age:  19w 1d                CI:        73.02   70 - 86
                                                      FL/HC:      20.0   16.1 -

 HC:     162.2  mm     G. Age:  19w 0d       36  %
                                                      FL/BPD:
 FL:      32.5  mm     G. Age:  20w 1d       77  %
 HUM:       29  mm     G. Age:  19w 3d       58  %
Gestational Age

 Clinical EDD:  19w 1d                                        EDD:   03/02/11
 U/S Today:     19w 3d                                        EDD:   02/28/11
 Best:          19w 1d     Det. By:  Clinical EDD             EDD:   03/02/11
Anatomy

 Cranium:           Appears normal      Aortic Arch:       Not well
                                                           visualized
 Fetal Cavum:       Appears normal      Ductal Arch:       Not well
                                                           visualized
 Ventricles:        Appears normal      Diaphragm:         Appears normal
 Choroid Plexus:    Appears normal      Stomach:           Appears
                                                           normal, left
                                                           sided
 Cerebellum:        Not well            Abdomen:           Appears normal
                    visualized
 Posterior Fossa:   Not well            Abdominal Wall:    Appears nml
                    visualized                             (cord insert,
                                                           abd wall)
 Nuchal Fold:       Not well            Cord Vessels:      Appears normal
                    visualized                             (3 vessel cord)
 Face:              Appears normal      Kidneys:           Appear normal
                    (lips/profile/orbit
                    s)
 Heart:             Appears normal      Bladder:           Appears normal
                    (4 chamber &
                    axis)
 RVOT:              Not well            Spine:             Not well
                    visualized                             visualized
 LVOT:              Appears normal      Limbs:             Appears normal
                                                           (hands, ankles,
                                                           feet)

 Other:     Fetus appears to be a female. Technically difficult due to
            fetal position.
Cervix Uterus Adnexa

 Cervical Length:    3.7      cm

 Cervix:       Normal appearance by transabdominal scan.
 Uterus:       No abnormality visualized.
 Cul De Sac:   No free fluid seen.

 Left Ovary:    Not visualized.
 Right Ovary:   Within normal limits.
 Adnexa:     No abnormality visualized.
Comments

 Accurate AC not obtained secondary to fetal position.
Impression

   Single living intrauterine gestation with concordant
 gestational age and normal visualized anatomy. The fetal
 spine and posterior fossa are ncompletely seen today, so
 follow up ultrasound in 2 weeks is recommended.  Because
 of incomplete anatomy scan today, modified risk for Trisomy
 21 can be calculated when the patient returns.

  Marginal insertion of umbilical cord into placenta, 1.1 cm
 from edge.

 questions or concerns.

## 2013-05-09 ENCOUNTER — Encounter: Payer: Self-pay | Admitting: *Deleted

## 2013-05-17 ENCOUNTER — Ambulatory Visit: Payer: BC Managed Care – PPO | Admitting: Cardiology

## 2013-05-24 ENCOUNTER — Other Ambulatory Visit: Payer: Self-pay | Admitting: *Deleted

## 2013-05-31 ENCOUNTER — Encounter: Payer: Self-pay | Admitting: *Deleted

## 2013-06-01 ENCOUNTER — Encounter: Payer: Self-pay | Admitting: Cardiology

## 2013-06-01 ENCOUNTER — Ambulatory Visit (INDEPENDENT_AMBULATORY_CARE_PROVIDER_SITE_OTHER): Payer: 59 | Admitting: Cardiology

## 2013-06-01 VITALS — BP 124/87 | HR 88 | Ht 66.0 in | Wt 175.4 lb

## 2013-06-01 DIAGNOSIS — I10 Essential (primary) hypertension: Secondary | ICD-10-CM

## 2013-06-01 DIAGNOSIS — R000 Tachycardia, unspecified: Secondary | ICD-10-CM

## 2013-06-01 LAB — COMPREHENSIVE METABOLIC PANEL
ALT: 14 U/L (ref 0–35)
AST: 16 U/L (ref 0–37)
Albumin: 4.4 g/dL (ref 3.5–5.2)
Alkaline Phosphatase: 54 U/L (ref 39–117)
BUN: 15 mg/dL (ref 6–23)
CO2: 28 mEq/L (ref 19–32)
Calcium: 9.5 mg/dL (ref 8.4–10.5)
Chloride: 102 mEq/L (ref 96–112)
Creatinine, Ser: 0.7 mg/dL (ref 0.4–1.2)
GFR: 108.82 mL/min (ref >60.00)
Glucose, Bld: 90 mg/dL (ref 70–99)
Potassium: 3.4 mEq/L — ABNORMAL LOW (ref 3.5–5.1)
Sodium: 138 mEq/L (ref 135–145)
Total Bilirubin: 0.7 mg/dL (ref 0.3–1.2)
Total Protein: 8.2 g/dL (ref 6.0–8.3)

## 2013-06-01 LAB — CBC WITH DIFFERENTIAL/PLATELET
Basophils Absolute: 0 10*3/uL (ref 0.0–0.1)
Basophils Relative: 0.2 % (ref 0.0–3.0)
Eosinophils Absolute: 0.1 10*3/uL (ref 0.0–0.7)
Eosinophils Relative: 1.3 % (ref 0.0–5.0)
HCT: 39.7 % (ref 36.0–46.0)
Hemoglobin: 13.5 g/dL (ref 12.0–15.0)
Lymphocytes Relative: 34.4 % (ref 12.0–46.0)
Lymphs Abs: 2.9 10*3/uL (ref 0.7–4.0)
MCHC: 34 g/dL (ref 30.0–36.0)
MCV: 89.3 fl (ref 78.0–100.0)
Monocytes Absolute: 0.6 10*3/uL (ref 0.1–1.0)
Monocytes Relative: 6.8 % (ref 3.0–12.0)
Neutro Abs: 4.9 10*3/uL (ref 1.4–7.7)
Neutrophils Relative %: 57.3 % (ref 43.0–77.0)
Platelets: 284 10*3/uL (ref 150.0–400.0)
RBC: 4.44 Mil/uL (ref 3.87–5.11)
RDW: 12.8 % (ref 11.5–14.6)
WBC: 8.5 10*3/uL (ref 4.5–10.5)

## 2013-06-01 LAB — TSH: TSH: 0.5 u[IU]/mL (ref 0.35–5.50)

## 2013-06-01 MED ORDER — DILTIAZEM HCL ER COATED BEADS 120 MG PO CP24: 120.0000 mg | ORAL_CAPSULE | Freq: Every day | ORAL | Status: DC

## 2013-06-01 NOTE — Patient Instructions (Signed)
**Note De-Identified Hulen Mandler Obfuscation** Your physician has requested that you have an echocardiogram. Echocardiography is a painless test that uses sound waves to create images of your heart. It provides your doctor with information about the size and shape of your heart and how well your heart's chambers and valves are working. This procedure takes approximately one hour. There are no restrictions for this procedure.  Your physician has recommended that you wear a 48 hour holter monitor. Holter monitors are medical devices that record the heart's electrical activity. Doctors most often use these monitors to diagnose arrhythmias. Arrhythmias are problems with the speed or rhythm of the heartbeat. The monitor is a small, portable device. You can wear one while you do your normal daily activities. This is usually used to diagnose what is causing palpitations/syncope (passing out).  Your physician recommends that you return for lab work in: today  Your physician recommends that you schedule a follow-up appointment in: after test

## 2013-06-01 NOTE — Progress Notes (Signed)
Patient ID: Dawn Meadows, female   DOB: Jan 03, 1986, 28 y.o.   MRN: [id number]     Patient Name: Dawn Meadows Date of Encounter: 06/01/2013  Primary Care Provider:  Coletta Memos, PA-C Primary Cardiologist:  Dawn Meadows, H  Problem List   Past Medical History  Diagnosis Date  . Anxiety     was on zoloft and effexor til 12/2010  . Gestational hypertension   . Headache(784.0)   . Other malaise and fatigue   . Panic attacks    Past Surgical History  Procedure Laterality Date  . Wisdom tooth extraction    . Vaginal delivery      Allergies  Allergies  Allergen Reactions  . Codeine Nausea And Vomiting  . Penicillins Nausea And Vomiting    HPI  A very pleasant 28 year old female who was diagnosed with a station of hypertension during her pregnancy 2 years ago. The patient states that she wasn't on blood pressure medication during her pregnancy and eventually she was used for increasing blood pressure at 38 weeks. She states that she never developed preeclampsia the stress concern for a high blood pressure and she was already at term. The patient states that her blood pressure has never improved after her pregnancy and she was started on amlodipine at first after she to which she developed lower extremity edema. This was switched for diltiazem 60 twice a day that she is currently using. The patient appears a very healthy young female she is generally reactive and denies any symptoms of palpitations, chest pain, shortness of breath or any other limitations.  The patient describes that about 2 months ago she had several episodes of face flushing, associated with significant shortness of breath, chest tightness and she was found to have high blood pressure in the 160s to 180s. This was associated with nausea and dizziness. She has experienced about 3 of those episodes.  Home Medications  Prior to Admission medications   Medication Sig Start Date End Date Taking? Authorizing  Provider  amLODipine (NORVASC) 5 MG tablet Take 5 mg by mouth daily.   Yes Historical Provider, MD  diltiazem (CARDIZEM) 60 MG tablet Take 1 tablet by mouth 2 (two) times daily. 04/21/13  Yes Historical Provider, MD  hydrocortisone-pramoxine (PRAMOSONE) 1-2.5 % LOTN Apply topically 2 (two) times daily.   Yes Historical Provider, MD  sertraline (ZOLOFT) 50 MG tablet Take 1 tablet by mouth daily. 04/21/13  Yes Historical Provider, MD    Family History  Family History  Problem Relation Age of Onset  . Breast cancer Maternal Grandmother   . Lung cancer Maternal Grandmother   . Hypotension Neg Hx   . Malignant hyperthermia Neg Hx   . Pseudochol deficiency Neg Hx   . Anesthesia problems Neg Hx     Social History  History   Social History  . Marital Status: Married    Spouse Name: N/A    Number of Children: N/A  . Years of Education: N/A   Occupational History  . Not on file.   Social History Main Topics  . Smoking status: Never Smoker   . Smokeless tobacco: Never Used  . Alcohol Use: No  . Drug Use: No  . Sexual Activity: Yes    Birth Control/ Protection: IUD   Other Topics Concern  . Not on file   Social History Narrative  . No narrative on file     Review of Systems, as per HPI, otherwise negative General:  No chills, fever, night sweats or  weight changes.  Cardiovascular:  No chest pain, dyspnea on exertion, edema, orthopnea, palpitations, paroxysmal nocturnal dyspnea. Dermatological: No rash, lesions/masses Respiratory: No cough, dyspnea Urologic: No hematuria, dysuria Abdominal:   No nausea, vomiting, diarrhea, bright red blood per rectum, melena, or hematemesis Neurologic:  No visual changes, wkns, changes in mental status. All other systems reviewed and are otherwise negative except as noted above.  Physical Exam  Blood pressure 124/87, pulse 88, height 5\' 6"  (1.676 m), weight 175 lb 6.4 oz (79.561 kg).  General: Pleasant, NAD Psych: Normal  affect. Neuro: Alert and oriented X 3. Moves all extremities spontaneously. HEENT: Normal  Neck: Supple without bruits or JVD. Lungs:  Resp regular and unlabored, CTA. Heart: RRR no s3, s4, or murmurs. Abdomen: Soft, non-tender, non-distended, BS + x 4.  Extremities: No clubbing, cyanosis or edema. DP/PT/Radials 2+ and equal bilaterally.  Labs:  No results found for this basename: CKTOTAL, CKMB, TROPONINI,  in the last 72 hours Lab Results  Component Value Date   WBC 13.0* 02/25/2011   HGB 9.6* 02/25/2011   HCT 29.2* 02/25/2011   MCV 93.9 02/25/2011   PLT 224 02/25/2011   Accessory Clinical Findings  Echocardiogram - none  ECG - normal sinus rhythm, normal EKG    Assessment & Plan  1. Poorly controlled hypertension - the young female developed gestational hypertension and has never improved after she delivered her baby she also describes symptoms that are concerning for carcinoid syndrome. We will all order some basic labs including CBC, comprehensive metabolic profile, TSH but also plasma running activity, urine, and plasma catecholamines. We will change her current dose of diltiazem and 60 twice a day to 120 mg CD daily as she doesn't always remember to take her evening medication. We will order an echocardiogram to evaluate for systolic diastolic function and degree of ventricular hypertrophy. We will also order a Holter monitoring to evaluate for arrhythmias associated with her palpitations.  Followup in 3 weeks.  Dawn Spark, MD, [facility] 06/01/2013, 12:41 PM

## 2013-06-02 DIAGNOSIS — I10 Essential (primary) hypertension: Secondary | ICD-10-CM | POA: Insufficient documentation

## 2013-06-06 LAB — RENIN: Renin Activity: 0.96 ng/mL/h (ref 0.25–5.82)

## 2013-06-07 ENCOUNTER — Other Ambulatory Visit: Payer: Self-pay

## 2013-06-07 MED ORDER — POTASSIUM CHLORIDE ER 10 MEQ PO TBCR: 10.0000 meq | EXTENDED_RELEASE_TABLET | Freq: Every day | ORAL | Status: DC

## 2013-06-22 ENCOUNTER — Other Ambulatory Visit (HOSPITAL_COMMUNITY): Payer: 59

## 2013-06-29 ENCOUNTER — Ambulatory Visit: Payer: 59 | Admitting: Cardiology

## 2013-07-03 ENCOUNTER — Other Ambulatory Visit (HOSPITAL_COMMUNITY): Payer: Self-pay | Admitting: Physician Assistant

## 2013-07-03 DIAGNOSIS — I10 Essential (primary) hypertension: Secondary | ICD-10-CM

## 2013-07-11 ENCOUNTER — Ambulatory Visit (HOSPITAL_COMMUNITY)
Admission: RE | Admit: 2013-07-11 | Discharge: 2013-07-11 | Disposition: A | Discharge status: Home / Self Care | Payer: 59 | Source: Ambulatory Visit | Attending: Cardiovascular Disease | Admitting: Cardiovascular Disease

## 2013-07-11 DIAGNOSIS — I10 Essential (primary) hypertension: Secondary | ICD-10-CM

## 2013-07-11 HISTORY — PX: TRANSTHORACIC ECHOCARDIOGRAM: SHX275

## 2013-07-11 NOTE — Progress Notes (Signed)
2D Echo Performed 07/11/2013    Stephanne Greeley, RCS  

## 2014-03-12 ENCOUNTER — Encounter: Payer: Self-pay | Admitting: Cardiology

## 2016-01-03 LAB — OB RESULTS CONSOLE RUBELLA ANTIBODY, IGM: Rubella: IMMUNE

## 2016-01-03 LAB — OB RESULTS CONSOLE ABO/RH: RH TYPE: POSITIVE

## 2016-01-03 LAB — OB RESULTS CONSOLE HIV ANTIBODY (ROUTINE TESTING): HIV: NONREACTIVE

## 2016-01-03 LAB — OB RESULTS CONSOLE ANTIBODY SCREEN: ANTIBODY SCREEN: NEGATIVE

## 2016-01-03 LAB — OB RESULTS CONSOLE GC/CHLAMYDIA
CHLAMYDIA, DNA PROBE: NEGATIVE
GC PROBE AMP, GENITAL: NEGATIVE

## 2016-01-03 LAB — OB RESULTS CONSOLE RPR: RPR: NONREACTIVE

## 2016-01-03 LAB — OB RESULTS CONSOLE HEPATITIS B SURFACE ANTIGEN: HEP B S AG: NEGATIVE

## 2016-05-11 NOTE — L&D Delivery Note (Signed)
Delivery Note I was finishing a delivery when called emergently to room for a prolonged bradycardia to the 90's which had not responded to turning pitocin off, terbutaline, and repositioning. There was a run of tachysystole just prior to that.  I checked pt and found her to have an anterior lip, 0 station.  We applied an FSE and the baby began to respond with FHR improved to 115-120.  I instructed patient on pushing and reduced the lip.  She made great progress and brought the vertex to +2 position so we continued and prepped her for delivery.  She pushed about 10 minutes and at 5:59 PM a viable female was delivered via Vaginal, Spontaneous Delivery (Presentation: OA  ).  APGAR: 9, 9; weight pending .   Placenta status: delivered spontaneously .  Cord:  with the following complications: nuchal x 1 reduced over head .  C Baby delivered and had vigorous cry. Mild uterine atony responded well to pitocin and massage. Anesthesia:  epidural Episiotomy: None Lacerations: First degree Suture Repair: 2.0 vicryl rapide Est. Blood Loss (mL): 350 ml  Mom to postpartum.  Baby to Couplet care / Skin to Skin. Will follow BP postpartum and restart labetalol 100mg  po TID.   Logan Bores 07/21/2016, 6:55 PM

## 2016-06-30 LAB — OB RESULTS CONSOLE GBS: GBS: NEGATIVE

## 2016-07-13 ENCOUNTER — Telehealth (HOSPITAL_COMMUNITY): Payer: Self-pay | Admitting: *Deleted

## 2016-07-13 ENCOUNTER — Encounter (HOSPITAL_COMMUNITY): Payer: Self-pay | Admitting: *Deleted

## 2016-07-13 NOTE — Telephone Encounter (Signed)
Preadmission screen  

## 2016-07-20 NOTE — H&P (Signed)
Theta Leaf is a 31 y.o. female at 38 1/7 weeks (EDD 08/03/16 by 6 week [place])  presenting for IOL given chronic hypertension stable throughout most of pregnancy with low dose labetalol then requiring increasing doses of labetalol to control over the last 3 weeks. PIH labs and NST's WNL and no sx of preeclampsia except mild intermittent HA.    Diagnosed with HSV at 25 weeks, on valtrex suppression.  H/o anxiety and restarted zoloft in late second trimester with good control.    OB History    Gravida Para Term Preterm AB Living   2 1 1  0 0 1   SAB TAB Ectopic Multiple Live Births   0 0 0 0 1    NSVD 39 weeks 2012 7#  Past Medical History:  Diagnosis Date  . Anxiety    was on zoloft and effexor til 12/2010  . Gestational hypertension   . Headache(784.0)   . Other malaise and fatigue   . Panic attacks    Past Surgical History:  Procedure Laterality Date  . VAGINAL DELIVERY    . WISDOM TOOTH EXTRACTION     Family History: family history includes Breast cancer in her maternal grandmother; Lung cancer in her maternal grandmother. Social History:  reports that she has never smoked. She has never used smokeless tobacco. She reports that she does not drink alcohol or use drugs.     Maternal Diabetes: No Genetic Screening: Normal Maternal Ultrasounds/Referrals: Normal Fetal Ultrasounds or other Referrals:  None Maternal Substance Abuse:  No Significant Maternal Medications:  Meds include: Zoloft Other: labetalol Significant Maternal Lab Results:  None Other Comments:  None  Review of Systems  Gastrointestinal: Negative for abdominal pain.  Neurological: Positive for headaches.   Maternal Medical History:  Contractions: Frequency: irregular.   Perceived severity is mild.    Fetal activity: Perceived fetal activity is normal.    Prenatal complications: PIH.   Prenatal Complications - Diabetes: none.      There were no vitals taken for this visit. Maternal Exam:  Uterine  Assessment: Contraction strength is mild.  Contraction frequency is irregular.   Abdomen: Patient reports no abdominal tenderness. Estimated fetal weight is 7 1/2 lbs.   Fetal presentation: vertex  Introitus: Normal vulva. Normal vagina.    Physical Exam  Constitutional: She appears well-developed.  Cardiovascular: Normal rate and regular rhythm.   Respiratory: Effort normal.  GI: Soft.  Genitourinary: Vagina normal.  Neurological: She is alert.  Psychiatric: She has a normal mood and affect.    Prenatal labs: ABO, Rh: O/Positive/-- (08/25 0000) Antibody: Negative (08/25 0000) Rubella: Immune (08/25 0000) RPR: Nonreactive (08/25 0000)  HBsAg: Negative (08/25 0000)  HIV: Non-reactive (08/25 0000)  GBS: Negative (02/20 0000)  First trimester screen and AFP negative CF negative One hour GCT 125  Assessment/Plan: Pt for IOL at term with exacerbation of chronic hypertension requiring increasing doses of labetalol to maintain BP 150/90's.  No evidence of preeclampsia, but will recheck labs on admission.  Plan AROM and pitocin, manage BP with labetalol prn  Kendell Gammon W 07/20/2016, 7:07 PM

## 2016-07-21 ENCOUNTER — Encounter (HOSPITAL_COMMUNITY): Payer: Self-pay

## 2016-07-21 ENCOUNTER — Inpatient Hospital Stay (HOSPITAL_COMMUNITY)
Admission: RE | Admit: 2016-07-21 | Discharge: 2016-07-23 | DRG: 774 | Disposition: A | Discharge status: Home / Self Care | Payer: Commercial Managed Care - HMO | Source: Ambulatory Visit | Attending: Obstetrics and Gynecology | Admitting: Obstetrics and Gynecology

## 2016-07-21 ENCOUNTER — Inpatient Hospital Stay (HOSPITAL_COMMUNITY): Payer: Commercial Managed Care - HMO | Admitting: Anesthesiology

## 2016-07-21 DIAGNOSIS — O1002 Pre-existing essential hypertension complicating childbirth: Principal | ICD-10-CM | POA: Diagnosis present

## 2016-07-21 DIAGNOSIS — O10913 Unspecified pre-existing hypertension complicating pregnancy, third trimester: Secondary | ICD-10-CM | POA: Diagnosis present

## 2016-07-21 DIAGNOSIS — A6000 Herpesviral infection of urogenital system, unspecified: Secondary | ICD-10-CM | POA: Diagnosis present

## 2016-07-21 DIAGNOSIS — F419 Anxiety disorder, unspecified: Secondary | ICD-10-CM | POA: Diagnosis present

## 2016-07-21 DIAGNOSIS — O99344 Other mental disorders complicating childbirth: Secondary | ICD-10-CM | POA: Diagnosis present

## 2016-07-21 DIAGNOSIS — O9832 Other infections with a predominantly sexual mode of transmission complicating childbirth: Secondary | ICD-10-CM | POA: Diagnosis present

## 2016-07-21 DIAGNOSIS — Z3A38 38 weeks gestation of pregnancy: Secondary | ICD-10-CM

## 2016-07-21 LAB — COMPREHENSIVE METABOLIC PANEL
ALBUMIN: 3.1 g/dL — AB (ref 3.5–5.0)
ALK PHOS: 119 U/L (ref 38–126)
ALT: 12 U/L — AB (ref 14–54)
ANION GAP: 10 (ref 5–15)
AST: 19 U/L (ref 15–41)
BILIRUBIN TOTAL: 0.6 mg/dL (ref 0.3–1.2)
BUN: 7 mg/dL (ref 6–20)
CALCIUM: 8.7 mg/dL — AB (ref 8.9–10.3)
CO2: 23 mmol/L (ref 22–32)
CREATININE: 0.55 mg/dL (ref 0.44–1.00)
Chloride: 103 mmol/L (ref 101–111)
GFR calc Af Amer: >60 mL/min (ref >60)
GFR calc non Af Amer: >60 mL/min (ref >60)
Glucose, Bld: 97 mg/dL (ref 65–99)
Potassium: 3.6 mmol/L (ref 3.5–5.1)
SODIUM: 136 mmol/L (ref 135–145)
TOTAL PROTEIN: 6.6 g/dL (ref 6.5–8.1)

## 2016-07-21 LAB — PROTEIN / CREATININE RATIO, URINE
Creatinine, Urine: 27 mg/dL
PROTEIN CREATININE RATIO: 0.41 mg/mg{creat} — AB (ref 0.00–0.15)
Total Protein, Urine: 11 mg/dL

## 2016-07-21 LAB — CBC
HCT: 31.5 % — ABNORMAL LOW (ref 36.0–46.0)
HCT: 33.5 % — ABNORMAL LOW (ref 36.0–46.0)
HEMOGLOBIN: 11.1 g/dL — AB (ref 12.0–15.0)
Hemoglobin: 10.6 g/dL — ABNORMAL LOW (ref 12.0–15.0)
MCH: 30.9 pg (ref 26.0–34.0)
MCH: 30.5 pg (ref 26.0–34.0)
MCHC: 33.7 g/dL (ref 30.0–36.0)
MCHC: 33.1 g/dL (ref 30.0–36.0)
MCV: 91.8 fL (ref 78.0–100.0)
MCV: 92 fL (ref 78.0–100.0)
PLATELETS: 219 10*3/uL (ref 150–400)
Platelets: 212 10*3/uL (ref 150–400)
RBC: 3.43 MIL/uL — ABNORMAL LOW (ref 3.87–5.11)
RBC: 3.64 MIL/uL — AB (ref 3.87–5.11)
RDW: 14.2 % (ref 11.5–15.5)
RDW: 14.2 % (ref 11.5–15.5)
WBC: 9.9 10*3/uL (ref 4.0–10.5)
WBC: 16.2 10*3/uL — ABNORMAL HIGH (ref 4.0–10.5)

## 2016-07-21 LAB — TYPE AND SCREEN
ABO/RH(D): O POS
ANTIBODY SCREEN: NEGATIVE

## 2016-07-21 LAB — ABO/RH: ABO/RH(D): O POS

## 2016-07-21 MED ORDER — EPHEDRINE 5 MG/ML INJ
10.0000 mg | INTRAVENOUS | Status: DC | PRN
  Filled 2016-07-21: qty 4

## 2016-07-21 MED ORDER — ONDANSETRON HCL 4 MG PO TABS
4.0000 mg | ORAL_TABLET | ORAL | Status: DC | PRN
Start: 2016-07-21 — End: 2016-07-23

## 2016-07-21 MED ORDER — COCONUT OIL OIL
1.0000 "application " | TOPICAL_OIL | Status: DC | PRN
  Administered 2016-07-22: 1 via TOPICAL
  Filled 2016-07-21: qty 120

## 2016-07-21 MED ORDER — TERBUTALINE SULFATE 1 MG/ML IJ SOLN
0.2500 mg | Freq: Once | INTRAMUSCULAR | Status: AC | PRN
  Administered 2016-07-21: 0.25 mg via SUBCUTANEOUS
  Filled 2016-07-21: qty 1

## 2016-07-21 MED ORDER — LACTATED RINGERS IV SOLN: 500.0000 mL | INTRAVENOUS | Status: DC | PRN

## 2016-07-21 MED ORDER — LIDOCAINE HCL (PF) 1 % IJ SOLN
30.0000 mL | INTRAMUSCULAR | Status: DC | PRN
  Filled 2016-07-21: qty 30

## 2016-07-21 MED ORDER — SIMETHICONE 80 MG PO CHEW: 80.0000 mg | CHEWABLE_TABLET | ORAL | Status: DC | PRN

## 2016-07-21 MED ORDER — SERTRALINE HCL 50 MG PO TABS
50.0000 mg | ORAL_TABLET | Freq: Every day | ORAL | Status: DC
  Administered 2016-07-23: 50 mg via ORAL
  Filled 2016-07-21 (×3): qty 1

## 2016-07-21 MED ORDER — LABETALOL HCL 100 MG PO TABS
100.0000 mg | ORAL_TABLET | Freq: Three times a day (TID) | ORAL | Status: DC
  Administered 2016-07-21 – 2016-07-22 (×2): 100 mg via ORAL
  Filled 2016-07-21 (×2): qty 1

## 2016-07-21 MED ORDER — PRENATAL MULTIVITAMIN CH
1.0000 | ORAL_TABLET | Freq: Every day | ORAL | Status: DC
  Administered 2016-07-22: 1 via ORAL
  Filled 2016-07-21: qty 1

## 2016-07-21 MED ORDER — ONDANSETRON HCL 4 MG/2ML IJ SOLN: 4.0000 mg | Freq: Four times a day (QID) | INTRAMUSCULAR | Status: DC | PRN

## 2016-07-21 MED ORDER — PHENYLEPHRINE 40 MCG/ML (10ML) SYRINGE FOR IV PUSH (FOR BLOOD PRESSURE SUPPORT): 80.0000 ug | PREFILLED_SYRINGE | INTRAVENOUS | Status: DC | PRN

## 2016-07-21 MED ORDER — FENTANYL 2.5 MCG/ML BUPIVACAINE 1/10 % EPIDURAL INFUSION (WH - ANES): 14.0000 mL/h | INTRAMUSCULAR | Status: DC | PRN

## 2016-07-21 MED ORDER — ACETAMINOPHEN 325 MG PO TABS: 650.0000 mg | ORAL_TABLET | ORAL | Status: DC | PRN

## 2016-07-21 MED ORDER — LIDOCAINE HCL (PF) 1 % IJ SOLN
INTRAMUSCULAR | Status: DC | PRN
  Administered 2016-07-21 (×2): 4 mL

## 2016-07-21 MED ORDER — TETANUS-DIPHTH-ACELL PERTUSSIS 5-2.5-18.5 LF-MCG/0.5 IM SUSP: 0.5000 mL | Freq: Once | INTRAMUSCULAR | Status: DC

## 2016-07-21 MED ORDER — ZOLPIDEM TARTRATE 5 MG PO TABS: 5.0000 mg | ORAL_TABLET | Freq: Every evening | ORAL | Status: DC | PRN

## 2016-07-21 MED ORDER — DIBUCAINE 1 % RE OINT: 1.0000 "application " | TOPICAL_OINTMENT | RECTAL | Status: DC | PRN

## 2016-07-21 MED ORDER — LACTATED RINGERS IV SOLN
500.0000 mL | Freq: Once | INTRAVENOUS | Status: AC
  Administered 2016-07-21: 500 mL via INTRAVENOUS

## 2016-07-21 MED ORDER — OXYTOCIN BOLUS FROM INFUSION: 500.0000 mL | Freq: Once | INTRAVENOUS | Status: DC

## 2016-07-21 MED ORDER — DIPHENHYDRAMINE HCL 50 MG/ML IJ SOLN
12.5000 mg | INTRAMUSCULAR | Status: DC | PRN
Start: 2016-07-21 — End: 2016-07-21

## 2016-07-21 MED ORDER — EPHEDRINE 5 MG/ML INJ: 10.0000 mg | INTRAVENOUS | Status: DC | PRN

## 2016-07-21 MED ORDER — WITCH HAZEL-GLYCERIN EX PADS: 1.0000 "application " | MEDICATED_PAD | CUTANEOUS | Status: DC | PRN

## 2016-07-21 MED ORDER — LABETALOL HCL 5 MG/ML IV SOLN
20.0000 mg | INTRAVENOUS | Status: AC | PRN
  Administered 2016-07-21: 80 mg via INTRAVENOUS
  Administered 2016-07-21: 40 mg via INTRAVENOUS
  Administered 2016-07-21: 20 mg via INTRAVENOUS
  Filled 2016-07-21: qty 8
  Filled 2016-07-21: qty 16
  Filled 2016-07-21: qty 4

## 2016-07-21 MED ORDER — PHENYLEPHRINE 40 MCG/ML (10ML) SYRINGE FOR IV PUSH (FOR BLOOD PRESSURE SUPPORT)
80.0000 ug | PREFILLED_SYRINGE | INTRAVENOUS | Status: DC | PRN
  Filled 2016-07-21: qty 5

## 2016-07-21 MED ORDER — DIPHENHYDRAMINE HCL 25 MG PO CAPS: 25.0000 mg | ORAL_CAPSULE | Freq: Four times a day (QID) | ORAL | Status: DC | PRN

## 2016-07-21 MED ORDER — OXYTOCIN 40 UNITS IN LACTATED RINGERS INFUSION - SIMPLE MED: 2.5000 [IU]/h | INTRAVENOUS | Status: DC

## 2016-07-21 MED ORDER — LACTATED RINGERS IV SOLN: 500.0000 mL | Freq: Once | INTRAVENOUS | Status: DC

## 2016-07-21 MED ORDER — ONDANSETRON HCL 4 MG/2ML IJ SOLN: 4.0000 mg | INTRAMUSCULAR | Status: DC | PRN

## 2016-07-21 MED ORDER — IBUPROFEN 600 MG PO TABS
600.0000 mg | ORAL_TABLET | Freq: Four times a day (QID) | ORAL | Status: DC
  Administered 2016-07-22 – 2016-07-23 (×6): 600 mg via ORAL
  Filled 2016-07-21 (×6): qty 1

## 2016-07-21 MED ORDER — FENTANYL 2.5 MCG/ML BUPIVACAINE 1/10 % EPIDURAL INFUSION (WH - ANES)
14.0000 mL/h | INTRAMUSCULAR | Status: DC | PRN
  Administered 2016-07-21: 14 mL/h via EPIDURAL
  Filled 2016-07-21: qty 100

## 2016-07-21 MED ORDER — ACETAMINOPHEN 325 MG PO TABS
650.0000 mg | ORAL_TABLET | ORAL | Status: DC | PRN
  Administered 2016-07-21 – 2016-07-22 (×2): 650 mg via ORAL
  Filled 2016-07-21 (×2): qty 2

## 2016-07-21 MED ORDER — DIPHENHYDRAMINE HCL 50 MG/ML IJ SOLN: 12.5000 mg | INTRAMUSCULAR | Status: DC | PRN

## 2016-07-21 MED ORDER — SOD CITRATE-CITRIC ACID 500-334 MG/5ML PO SOLN
30.0000 mL | ORAL | Status: DC | PRN
  Filled 2016-07-21: qty 15

## 2016-07-21 MED ORDER — HYDRALAZINE HCL 20 MG/ML IJ SOLN
10.0000 mg | Freq: Once | INTRAMUSCULAR | Status: AC | PRN
  Administered 2016-07-21: 10 mg via INTRAVENOUS
  Filled 2016-07-21: qty 1

## 2016-07-21 MED ORDER — SENNOSIDES-DOCUSATE SODIUM 8.6-50 MG PO TABS
2.0000 | ORAL_TABLET | ORAL | Status: DC
  Administered 2016-07-22 (×2): 2 via ORAL
  Filled 2016-07-21 (×2): qty 2

## 2016-07-21 MED ORDER — PHENYLEPHRINE 40 MCG/ML (10ML) SYRINGE FOR IV PUSH (FOR BLOOD PRESSURE SUPPORT)
80.0000 ug | PREFILLED_SYRINGE | INTRAVENOUS | Status: DC | PRN
  Filled 2016-07-21: qty 10
  Filled 2016-07-21: qty 5

## 2016-07-21 MED ORDER — OXYTOCIN 40 UNITS IN LACTATED RINGERS INFUSION - SIMPLE MED
1.0000 m[IU]/min | INTRAVENOUS | Status: DC
  Administered 2016-07-21: 2 m[IU]/min via INTRAVENOUS
  Filled 2016-07-21: qty 1000

## 2016-07-21 MED ORDER — BENZOCAINE-MENTHOL 20-0.5 % EX AERO
1.0000 "application " | INHALATION_SPRAY | CUTANEOUS | Status: DC | PRN
  Administered 2016-07-22: 1 via TOPICAL
  Filled 2016-07-21: qty 56

## 2016-07-21 MED ORDER — LACTATED RINGERS IV SOLN
INTRAVENOUS | Status: DC
  Administered 2016-07-21 (×3): via INTRAVENOUS

## 2016-07-21 NOTE — Anesthesia Preprocedure Evaluation (Signed)
Anesthesia Evaluation  Patient identified by MRN, date of birth, ID band Patient awake    Reviewed: Allergy & Precautions, H&P , Patient's Chart, lab work & pertinent test results  Airway Mallampati: II  TM Distance: >3 FB Neck ROM: full    Dental no notable dental hx.    Pulmonary neg pulmonary ROS,    Pulmonary exam normal breath sounds clear to auscultation       Cardiovascular hypertension, Pt. on medications  Rhythm:regular Rate:Normal     Neuro/Psych  Headaches, PSYCHIATRIC DISORDERS Anxiety    GI/Hepatic negative GI ROS, Neg liver ROS,   Endo/Other  negative endocrine ROS  Renal/GU negative Renal ROS     Musculoskeletal   Abdominal   Peds  Hematology negative hematology ROS (+)   Anesthesia Other Findings   Reproductive/Obstetrics (+) Pregnancy                            Anesthesia Physical  Anesthesia Plan  ASA: II  Anesthesia Plan: Epidural   Post-op Pain Management:    Induction:   Airway Management Planned:   Additional Equipment:   Intra-op Plan:   Post-operative Plan:   Informed Consent: I have reviewed the patients History and Physical, chart, labs and discussed the procedure including the risks, benefits and alternatives for the proposed anesthesia with the patient or authorized representative who has indicated his/her understanding and acceptance.     Plan Discussed with:   Anesthesia Plan Comments:         Anesthesia Quick Evaluation

## 2016-07-21 NOTE — Progress Notes (Signed)
Patient ID: Dawn Meadows, female   DOB: 29-Sep-1985, 31 y.o.   MRN: [id number] Pt getting uncomfortable and requesting epidural  afeb BP 140-160/90-100 FHR Category 1  Cervix 3/60/-1  BP stable, covered with protocol Will allow epidural and follow progress.

## 2016-07-21 NOTE — Progress Notes (Signed)
Patient ID: Dawn Meadows, female   DOB: June 22, 1985, 31 y.o.   MRN: [id number] Pt admitted and started on pitocin, no PIH sx, just anxious  BP initially 170/90 but improved to 150/90 with IV labetalol and regular po dose she took this AM PIH Labs WNL will check prot:creat ratio once has foley placed FHR Category 1  Cervix 50/2/-2 AROM scant clear  Will increase pitocin per protocol Epidural prn

## 2016-07-21 NOTE — Anesthesia Pain Management Evaluation Note (Signed)
  CRNA Pain Management Visit Note  Patient: Dawn Meadows, 31 y.o., female  "Hello I am a member of the anesthesia team at [facility]. We have an anesthesia team available at all times to provide care throughout the hospital, including epidural management and anesthesia for C-section. I don't know your plan for the delivery whether it a natural birth, water birth, IV sedation, nitrous supplementation, doula or epidural, but we want to meet your pain goals."   1.Was your pain managed to your expectations on prior hospitalizations?   Yes   2.What is your expectation for pain management during this hospitalization?     Epidural  3.How can we help you reach that goal?   Record the patient's initial score and the patient's pain goal.   Pain: 0  Pain Goal: 5 [facility] wants you to be able to say your pain was always managed very well.  Jabier Mutton 07/21/2016

## 2016-07-21 NOTE — Anesthesia Procedure Notes (Signed)
Epidural Patient location during procedure: OB  Staffing Anesthesiologist: Nolon Nations Performed: anesthesiologist   Preanesthetic Checklist Completed: patient identified, pre-op evaluation, timeout performed, IV checked, risks and benefits discussed and monitors and equipment checked  Epidural Patient position: sitting Prep: site prepped and draped and DuraPrep Patient monitoring: heart rate, continuous pulse ox and blood pressure Approach: midline Location: L2-L3 Injection technique: LOR air and LOR saline  Needle:  Needle type: Tuohy  Needle gauge: 17 G Needle length: 9 cm Needle insertion depth: 6 cm Catheter type: closed end flexible Catheter size: 19 Gauge Catheter at skin depth: 12 cm Test dose: negative  Assessment Sensory level: T8 Events: blood not aspirated, injection not painful, no injection resistance, negative IV test and no paresthesia  Additional Notes Reason for block:procedure for pain

## 2016-07-21 NOTE — Progress Notes (Signed)
Delivery of live viable female by Dr Marvel Plan. APGARS 8,9

## 2016-07-22 LAB — RPR: RPR: NONREACTIVE

## 2016-07-22 LAB — CBC
HCT: 29.7 % — ABNORMAL LOW (ref 36.0–46.0)
Hemoglobin: 10 g/dL — ABNORMAL LOW (ref 12.0–15.0)
MCH: 30.6 pg (ref 26.0–34.0)
MCHC: 33.7 g/dL (ref 30.0–36.0)
MCV: 90.8 fL (ref 78.0–100.0)
PLATELETS: 198 10*3/uL (ref 150–400)
RBC: 3.27 MIL/uL — ABNORMAL LOW (ref 3.87–5.11)
RDW: 14.4 % (ref 11.5–15.5)
WBC: 12.5 10*3/uL — AB (ref 4.0–10.5)

## 2016-07-22 MED ORDER — LABETALOL HCL 100 MG PO TABS
100.0000 mg | ORAL_TABLET | Freq: Two times a day (BID) | ORAL | Status: DC
  Administered 2016-07-22 – 2016-07-23 (×2): 100 mg via ORAL
  Filled 2016-07-22: qty 1

## 2016-07-22 MED ORDER — GUAIFENESIN ER 600 MG PO TB12
600.0000 mg | ORAL_TABLET | Freq: Two times a day (BID) | ORAL | Status: DC | PRN
  Administered 2016-07-22 – 2016-07-23 (×2): 600 mg via ORAL
  Filled 2016-07-22 (×4): qty 1

## 2016-07-22 NOTE — Progress Notes (Signed)
PPD #1 No problems Afeb, VSS, some BP 140/80 Fundus firm, NT at U-1 Continue routine postpartum care, will reduce Labetalol to 100 mg bid and monitor BP today

## 2016-07-22 NOTE — Progress Notes (Signed)
MOB was set up with a DEBP per her request. MOB wants to speak with LC about pumping and giving baby supplementation with formula, instead of latching baby to breast. MOB feels that her milk is not in and she wants to make sure baby is getting enough to eat. I explained that her colostrum is enough for baby and that waiting until the baby is 24 hours before resorting to supplementation with formula is best. I explained to MOB that LC would be in today to see her to discuss her concerns.

## 2016-07-22 NOTE — Anesthesia Postprocedure Evaluation (Signed)
Anesthesia Post Note  Patient: Dawn Meadows  Procedure(s) Performed: * No procedures listed *  Patient location during evaluation: Mother Baby Anesthesia Type: Epidural Level of consciousness: awake and alert Pain management: pain level controlled Vital Signs Assessment: post-procedure vital signs reviewed and stable Respiratory status: spontaneous breathing Cardiovascular status: blood pressure returned to baseline Postop Assessment: no headache, patient able to bend at knees, no backache, no signs of nausea or vomiting, epidural receding, adequate PO intake and spinal receding Anesthetic complications: no        Last Vitals:  Vitals:   07/22/16 0549 07/22/16 0900  BP: (!) 142/85 127/77  Pulse: 73 81  Resp: 18 16  Temp: 36.9 C 37.1 C    Last Pain:  Vitals:   07/22/16 0900  TempSrc: Oral  PainSc: 0-No pain   Pain Goal: Patients Stated Pain Goal: 5 (07/21/16 2204)               Birdena Crandall, Velvet Bathe

## 2016-07-22 NOTE — Plan of Care (Signed)
Problem: Activity: Goal: Ability to tolerate increased activity will improve Outcome: Completed/Met Date Met: 07/22/16 Pt ambulating independently in the room.  Encouraged to ambulate in hallways.   Problem: Urinary Elimination: Goal: Ability to reestablish a normal urinary elimination pattern will improve Outcome: Completed/Met Date Met: 07/22/16 Pt urinating without difficulty

## 2016-07-22 NOTE — Progress Notes (Signed)
MOB was referred for history of depression/anxiety. * Referral screened out by Clinical Social Worker because none of the following criteria appear to apply: ~ History of anxiety/depression during this pregnancy, or of post-partum depression. ~ Diagnosis of anxiety and/or depression within last 3 years OR * MOB's symptoms currently being treated with medication and/or therapy. Please contact the Clinical Social Worker if needs arise, or if MOB requests.  MOB has Rx for Zoloft.

## 2016-07-22 NOTE — Lactation Note (Signed)
This note was copied from a baby's chart. Lactation Consultation Note  Patient Name: Dawn Meadows Today's Date: 07/22/2016 Reason for consult: Initial assessment Mom reports 1st baby would not latch so she pump/bottle fed for few months then milk dried up. Mom reports this baby is latching well but she plans to BF some feedings and pump/bottle some feedings. Mom reports feeling anxious not being able to measure what baby is getting. Baby latched at this visit without difficulty demonstrating a good rhythmic suck. Basic teaching reviewed with Mom. Advised with BF baby should be at breast 8-12 times in 24 hours and with feeding ques but if this schedule is overwhelming for Mom. BF with feeding ques at least 1 breast per feeding, alternating breast each feeding. If she does not want to latch baby to 2nd breast but baby is still hungry then she can pump/supplement according to supplemental guidelines per hours of age, pumping for 15-20 minutes. Since this baby is latching better Mom trying to decide if she wants baby at breast every feeding. LC advised it is much easier to BF, both breast each feeding then supplement if baby not satisfied.  Advised if baby does not go to breast to be sure and pump every 3 hours for 15-20 minutes to bring her milk to volume. Lactation brochure left for review, advised of OP services and support group. Encouraged to call for assist as needed.   Maternal Data Has patient been taught Hand Expression?: Yes Does the patient have breastfeeding experience prior to this delivery?: Yes  Feeding Feeding Type: Breast Fed Length of feed: 5 min  LATCH Score/Interventions Latch: Grasps breast easily, tongue down, lips flanged, rhythmical sucking.  Audible Swallowing: A few with stimulation  Type of Nipple: Everted at rest and after stimulation  Comfort (Breast/Nipple): Soft / non-tender     Hold (Positioning): No assistance needed to correctly position infant at  breast. Intervention(s): Breastfeeding basics reviewed;Support Pillows;Position options;Skin to skin  LATCH Score: 9  Lactation Tools Discussed/Used Tools: Pump Breast pump type: Double-Electric Breast Pump WIC Program: No   Consult Status Consult Status: Follow-up Date: 07/23/16 Follow-up type: In-patient    Katrine Coho 07/22/2016, 2:53 PM

## 2016-07-22 NOTE — Plan of Care (Signed)
Problem: Nutritional: Goal: Mothers verbalization of comfort with breastfeeding process will improve Outcome: Progressing Pt reports her desire is to pump and bottle feed breastmilk.  Pt reports she doesn't want to put the baby to the breast long term but is willing to do so while in the hospital in order to help bring her milk in.  Pt reports that she wants to supplement after breastfeeding with either formula or breastmilk in order to ensure baby is getting enough.  Pt given Alimentum and artificial nipple per her request.  Risks of formula feeding given.  Feeding amounts and paced feeding discussed.  Pt and grandmother verbalize understanding.  Pt will breastfeed and then grandmother will supplement while Pt post pumps.  Pt encouraged to call RN for next latch.

## 2016-07-22 NOTE — Lactation Note (Signed)
This note was copied from a baby's chart. Lactation Consultation Note  Patient Name: Dawn Meadows [id number] Date: 07/22/2016 Reason for consult: Follow-up assessment Baby at 27 hr of life. RN suggested that mom needs comfort gels. Upon entry baby was at the breast in a modified cradle position with a shallow latch. Demonstrated how to position baby to get a deep latch and more efficient use of support pillows. Mom has "some soreness" on the R side. She stated the L side was "cracked open". At this visit no skin break down was noted but there entire nipple surface was bruised. Reviewed nipple care and give comfort gels.   Maternal Data    Feeding Feeding Type: Breast Fed  LATCH Score/Interventions Latch: Grasps breast easily, tongue down, lips flanged, rhythmical sucking. Intervention(s): Adjust position  Audible Swallowing: A few with stimulation  Type of Nipple: Everted at rest and after stimulation  Comfort (Breast/Nipple): Filling, red/small blisters or bruises, mild/mod discomfort  Problem noted: Mild/Moderate discomfort;Cracked, bleeding, blisters, bruises Interventions  (Cracked/bleeding/bruising/blister): Expressed breast milk to nipple Interventions (Mild/moderate discomfort): Comfort gels (coconut oil)  Hold (Positioning): Assistance needed to correctly position infant at breast and maintain latch. Intervention(s): Position options;Support Pillows  LATCH Score: 7  Lactation Tools Discussed/Used     Consult Status Consult Status: Follow-up Date: 07/23/16 Follow-up type: In-patient    Denzil Hughes 07/22/2016, 9:38 PM

## 2016-07-22 NOTE — Progress Notes (Signed)
Pt took her own Zoloft 50mg  that she brought from home.  Pt educated to not take any medications from home and to only take hospital supplied medications.  Pt verbalized understanding.

## 2016-07-23 MED ORDER — IBUPROFEN 800 MG PO TABS: 800.0000 mg | ORAL_TABLET | Freq: Three times a day (TID) | ORAL | 1 refills | Status: DC | PRN

## 2016-07-23 NOTE — Progress Notes (Addendum)
Post Partum Day 2 Subjective: no complaints, up ad lib, voiding, tolerating PO and nl lochia, pain controlled  Objective: Blood pressure (!) 159/89, pulse 73, temperature 97.9 F (36.6 C), resp. rate 18, height 5\' 6"  (1.676 m), weight 96.2 kg (212 lb), SpO2 98 %, unknown if currently breastfeeding.  Physical Exam:  General: alert and no distress Lochia: appropriate Uterine Fundus: firm   Recent Labs  07/21/16 2005 07/22/16 0648  HGB 11.1* 10.0*  HCT 33.5* 29.7*    Assessment/Plan: Discharge home, Breastfeeding and Lactation consult.  Routine care.  D/c with motrin and PNV.  f/u 6 weeks   LOS: 2 days   Meadows, Dawn Rappa 07/23/2016, 8:45 AM

## 2016-07-23 NOTE — Discharge Summary (Signed)
OB Discharge Summary     Patient Name: Dawn Meadows DOB: 1985/05/18 MRN: [id number]  Date of admission: 07/21/2016 Delivering MD: Paula Compton   Date of discharge: 07/23/2016  Admitting diagnosis: INDUCTION Intrauterine pregnancy: [redacted]w[redacted]d     Secondary diagnosis:  Active Problems:   Chronic hypertension with exacerbation during pregnancy, third trimester   NSVD (normal spontaneous vaginal delivery)  Additional problems: N/A     Discharge diagnosis: Term Pregnancy Delivered                                                                                                Post partum procedures:N/A  Augmentation: AROM and Pitocin  Complications: None  Hospital course:  Induction of Labor With Vaginal Delivery   31 y.o. yo G2P2002 at [redacted]w[redacted]d was admitted to the hospital 07/21/2016 for induction of labor.  Indication for induction: Gestational hypertension.  Patient had an uncomplicated labor course as follows: Membrane Rupture Time/Date: 9:00 AM ,07/21/2016   Intrapartum Procedures: Episiotomy: None [1]                                         Lacerations:  1st degree [2]  Patient had delivery of a Viable infant.  Information for the patient's newborn:  Mariachristina, Holle [[id number]]  Delivery Method: Vag-Spont   07/21/2016  Details of delivery can be found in separate delivery note.  Patient had a routine postpartum course. Patient is discharged home 07/23/16.  Physical exam  Vitals:   07/22/16 0900 07/22/16 1823 07/22/16 1924 07/23/16 0547  BP: 127/77 (!) 153/83 (!) 145/87 (!) 159/89  Pulse: 81 83  73  Resp: 16 18  18   Temp: 98.7 F (37.1 C) 98.3 F (36.8 C)  97.9 F (36.6 C)  TempSrc: Oral Oral    SpO2: 98%     Weight:      Height:       General: alert and no distress Lochia: appropriate Uterine Fundus: firm  Labs: Lab Results  Component Value Date   WBC 12.5 (H) 07/22/2016   HGB 10.0 (L) 07/22/2016   HCT 29.7 (L) 07/22/2016   MCV 90.8 07/22/2016   PLT  198 07/22/2016   CMP Latest Ref Rng & Units 07/21/2016  Glucose 65 - 99 mg/dL 97  BUN 6 - 20 mg/dL 7  Creatinine 0.44 - 1.00 mg/dL 0.55  Sodium 135 - 145 mmol/L 136  Potassium 3.5 - 5.1 mmol/L 3.6  Chloride 101 - 111 mmol/L 103  CO2 22 - 32 mmol/L 23  Calcium 8.9 - 10.3 mg/dL 8.7(L)  Total Protein 6.5 - 8.1 g/dL 6.6  Total Bilirubin 0.3 - 1.2 mg/dL 0.6  Alkaline Phos 38 - 126 U/L 119  AST 15 - 41 U/L 19  ALT 14 - 54 U/L 12(L)    Discharge instruction: per After Visit Summary and "Baby and Me Booklet".  After visit meds:  Allergies as of 07/23/2016      Reactions   Codeine Nausea And Vomiting   Penicillins Rash  Has patient had a PCN reaction causing immediate rash, facial/tongue/throat swelling, SOB or lightheadedness with hypotension: No Has patient had a PCN reaction causing severe rash involving mucus membranes or skin necrosis: No Has patient had a PCN reaction that required hospitalization no Has patient had a PCN reaction occurring within the last 10 years: No If all of the above answers are "NO", then may proceed with Cephalosporin use.      Medication List    TAKE these medications   acetaminophen 500 MG tablet Commonly known as:  TYLENOL Take 1,000 mg by mouth every 6 (six) hours as needed.   diltiazem 120 MG 24 hr capsule Commonly known as:  CARDIZEM CD Take 1 capsule (120 mg total) by mouth daily.   ibuprofen 800 MG tablet Commonly known as:  ADVIL,MOTRIN Take 1 tablet (800 mg total) by mouth every 8 (eight) hours as needed.   sertraline 50 MG tablet Commonly known as:  ZOLOFT Take 1 tablet by mouth daily.       Diet: routine diet  Activity: Advance as tolerated. Pelvic rest for 6 weeks.   Outpatient follow up:6 weeks Follow up Appt:No future appointments. Follow up Visit:No Follow-up on file.  Postpartum contraception: Progesterone only pills at 6 week checkup  Newborn Data: Live born female  Birth Weight: 7 lb 9.9 oz (3456 g) APGAR: 9,  9  Baby Feeding: Breast Disposition:home with mother   07/23/2016 Janyth Contes, MD

## 2016-07-23 NOTE — Lactation Note (Signed)
This note was copied from a baby's chart. Lactation Consultation Note  Patient Name: Dawn Meadows [id number] Date: 07/23/2016 Reason for consult: Follow-up assessment  Baby 33 hours old. Mom reports that she is putting the baby to breast now, and offering lots of STS. However, mom intends to pump and bottle-feeding EBM as she did with her first child. Enc mom to hand express after pumping, and discussed EBM storage guidelines. Mom reports the blister on her nipple is improving, but she was concerned about use of coconut oil. Enc using EBM on nipples and then wearing comfort gel. Mom also concerned that she had low supply with first child. Mom has been pumping since this child born--with each feeding/every 3 hours. Discussed progression of milk coming to volume. Mom has person DEBP/Medela. Mom aware of OP/BFSG and [place] phone line assistance after D/C.   Maternal Data    Feeding Feeding Type: Breast Fed Length of feed: 25 min  LATCH Score/Interventions                      Lactation Tools Discussed/Used     Consult Status Consult Status: PRN    Andres Labrum 07/23/2016, 10:08 AM

## 2016-07-25 ENCOUNTER — Encounter (HOSPITAL_COMMUNITY): Payer: Self-pay

## 2016-07-25 ENCOUNTER — Inpatient Hospital Stay (HOSPITAL_COMMUNITY)
Admission: AD | Admit: 2016-07-25 | Discharge: 2016-07-27 | DRG: 776 | Disposition: A | Discharge status: Home / Self Care | Payer: Commercial Managed Care - HMO | Source: Ambulatory Visit | Attending: Obstetrics and Gynecology | Admitting: Obstetrics and Gynecology

## 2016-07-25 DIAGNOSIS — O1003 Pre-existing essential hypertension complicating the puerperium: Secondary | ICD-10-CM | POA: Diagnosis not present

## 2016-07-25 DIAGNOSIS — O115 Pre-existing hypertension with pre-eclampsia, complicating the puerperium: Secondary | ICD-10-CM | POA: Diagnosis not present

## 2016-07-25 DIAGNOSIS — O99345 Other mental disorders complicating the puerperium: Secondary | ICD-10-CM | POA: Diagnosis present

## 2016-07-25 DIAGNOSIS — F419 Anxiety disorder, unspecified: Secondary | ICD-10-CM | POA: Diagnosis present

## 2016-07-25 DIAGNOSIS — O1415 Severe pre-eclampsia, complicating the puerperium: Secondary | ICD-10-CM

## 2016-07-25 DIAGNOSIS — O1414 Severe pre-eclampsia complicating childbirth: Secondary | ICD-10-CM

## 2016-07-25 DIAGNOSIS — O1495 Unspecified pre-eclampsia, complicating the puerperium: Secondary | ICD-10-CM | POA: Diagnosis present

## 2016-07-25 LAB — COMPREHENSIVE METABOLIC PANEL
ALBUMIN: 3.6 g/dL (ref 3.5–5.0)
ALT: 17 U/L (ref 14–54)
AST: 23 U/L (ref 15–41)
Alkaline Phosphatase: 94 U/L (ref 38–126)
Anion gap: 9 (ref 5–15)
BILIRUBIN TOTAL: 0.7 mg/dL (ref 0.3–1.2)
BUN: 13 mg/dL (ref 6–20)
CO2: 27 mmol/L (ref 22–32)
Calcium: 9 mg/dL (ref 8.9–10.3)
Chloride: 101 mmol/L (ref 101–111)
Creatinine, Ser: 0.67 mg/dL (ref 0.44–1.00)
GFR calc Af Amer: >60 mL/min (ref >60)
GFR calc non Af Amer: >60 mL/min (ref >60)
GLUCOSE: 99 mg/dL (ref 65–99)
POTASSIUM: 3.5 mmol/L (ref 3.5–5.1)
SODIUM: 137 mmol/L (ref 135–145)
TOTAL PROTEIN: 7.1 g/dL (ref 6.5–8.1)

## 2016-07-25 LAB — CBC
HEMATOCRIT: 34.9 % — AB (ref 36.0–46.0)
HEMOGLOBIN: 11.6 g/dL — AB (ref 12.0–15.0)
MCH: 30.6 pg (ref 26.0–34.0)
MCHC: 33.2 g/dL (ref 30.0–36.0)
MCV: 92.1 fL (ref 78.0–100.0)
Platelets: 274 10*3/uL (ref 150–400)
RBC: 3.79 MIL/uL — ABNORMAL LOW (ref 3.87–5.11)
RDW: 14.2 % (ref 11.5–15.5)
WBC: 9.7 10*3/uL (ref 4.0–10.5)

## 2016-07-25 LAB — URINALYSIS, ROUTINE W REFLEX MICROSCOPIC
BILIRUBIN URINE: NEGATIVE
GLUCOSE, UA: NEGATIVE mg/dL
Ketones, ur: NEGATIVE mg/dL
NITRITE: NEGATIVE
PH: 7 (ref 5.0–8.0)
Protein, ur: 30 mg/dL — AB
SPECIFIC GRAVITY, URINE: 1.017 (ref 1.005–1.030)
TRANS EPITHEL UA: 1

## 2016-07-25 LAB — PROTEIN / CREATININE RATIO, URINE
Creatinine, Urine: 110 mg/dL
Creatinine, Urine: 118 mg/dL
Protein Creatinine Ratio: 0.34 mg/mg{Cre} — ABNORMAL HIGH (ref 0.00–0.15)
Protein Creatinine Ratio: 0.31 mg/mg{Cre} — ABNORMAL HIGH (ref 0.00–0.15)
Total Protein, Urine: 37 mg/dL
Total Protein, Urine: 37 mg/dL

## 2016-07-25 MED ORDER — PRENATAL MULTIVITAMIN CH: 1.0000 | ORAL_TABLET | Freq: Every day | ORAL | Status: DC

## 2016-07-25 MED ORDER — MAGNESIUM SULFATE 50 % IJ SOLN
2.0000 g/h | INTRAVENOUS | Status: AC
  Administered 2016-07-25 – 2016-07-26 (×2): 2 g/h via INTRAVENOUS
  Filled 2016-07-25 (×2): qty 80

## 2016-07-25 MED ORDER — ACETAMINOPHEN 325 MG PO TABS
650.0000 mg | ORAL_TABLET | ORAL | Status: DC | PRN
  Administered 2016-07-26 – 2016-07-27 (×7): 650 mg via ORAL
  Filled 2016-07-25 (×7): qty 2

## 2016-07-25 MED ORDER — PRENATAL MULTIVITAMIN CH
1.0000 | ORAL_TABLET | Freq: Every day | ORAL | Status: DC
  Administered 2016-07-26: 1 via ORAL
  Filled 2016-07-25 (×2): qty 1

## 2016-07-25 MED ORDER — MAGNESIUM SULFATE BOLUS VIA INFUSION
4.0000 g | Freq: Once | INTRAVENOUS | Status: AC
  Administered 2016-07-25: 4 g via INTRAVENOUS
  Filled 2016-07-25: qty 500

## 2016-07-25 MED ORDER — SERTRALINE HCL 50 MG PO TABS
50.0000 mg | ORAL_TABLET | Freq: Every day | ORAL | Status: DC
  Administered 2016-07-26 – 2016-07-27 (×2): 50 mg via ORAL
  Filled 2016-07-25 (×2): qty 1

## 2016-07-25 MED ORDER — AMLODIPINE BESYLATE 10 MG PO TABS
10.0000 mg | ORAL_TABLET | Freq: Every day | ORAL | Status: DC
  Administered 2016-07-25: 10 mg via ORAL
  Filled 2016-07-25 (×2): qty 1

## 2016-07-25 MED ORDER — LABETALOL HCL 200 MG PO TABS
200.0000 mg | ORAL_TABLET | Freq: Three times a day (TID) | ORAL | Status: DC
  Administered 2016-07-25 – 2016-07-26 (×2): 200 mg via ORAL
  Filled 2016-07-25 (×3): qty 1

## 2016-07-25 MED ORDER — LIDOCAINE HCL 2 % EX GEL
1.0000 "application " | Freq: Once | CUTANEOUS | Status: AC
  Administered 2016-07-25: 1 via TOPICAL
  Filled 2016-07-25: qty 5

## 2016-07-25 MED ORDER — LACTATED RINGERS IV SOLN
INTRAVENOUS | Status: DC
  Administered 2016-07-25 – 2016-07-26 (×3): via INTRAVENOUS

## 2016-07-25 MED ORDER — LABETALOL HCL 5 MG/ML IV SOLN
20.0000 mg | Freq: Once | INTRAVENOUS | Status: AC
  Administered 2016-07-25: 20 mg via INTRAVENOUS
  Filled 2016-07-25: qty 4

## 2016-07-25 MED ORDER — COMPLETENATE 29-1 MG PO CHEW: 1.0000 | CHEWABLE_TABLET | Freq: Every day | ORAL | Status: DC

## 2016-07-25 NOTE — MAU Note (Addendum)
Has chonic HTN. SVD 07/21/16. Has had h/a past few days and poss due to being tired. Labetalol 200 TID and which has gradually been increased since delivery. Still has mild h/a and no order changes. B/P just creeping upward despite increasing meds. Last took 2nd dose of Labetalol at 1400. Tylenol is helping h/a

## 2016-07-25 NOTE — MAU Provider Note (Signed)
History     CSN: [id number]  Arrival date and time: 07/25/16 1934   First Provider Initiated Contact with Patient 07/25/16 2009      Chief Complaint  Patient presents with  . Hypertension   HPI   [name] is a 31 y.o. female G2P2002 here in MAU status post vaginal delivery on 3/13. She is here in MAU with concerns about her BP. She has chronic hypertension. Diagnosed 5 years ago. Is taking labetalol 200 mg PO TID.  She was induced at 38 weeks due to chronic hypertension with exacerbation during pregnancy.  She has had off and on HA that is relieved mostly with tylenol. Her BP at home was reported as 190's/100's.  OB History    Gravida Para Term Preterm AB Living   2 2 2 0 0 2   SAB TAB Ectopic Multiple Live Births   0 0 0 0 2      Past Medical History:  Diagnosis Date  . Anxiety    was on zoloft and effexor til 12/2010  . Gestational hypertension   . Headache(784.0)   . Other malaise and fatigue   . Panic attacks     Past Surgical History:  Procedure Laterality Date  . VAGINAL DELIVERY    . WISDOM TOOTH EXTRACTION      Family History  Problem Relation Age of Onset  . Breast cancer Maternal Grandmother   . Lung cancer Maternal Grandmother   . Hypotension Neg Hx   . Malignant hyperthermia Neg Hx   . Pseudochol deficiency Neg Hx   . Anesthesia problems Neg Hx     Social History  Substance Use Topics  . Smoking status: Never Smoker  . Smokeless tobacco: Never Used  . Alcohol use No    Allergies:  Allergies  Allergen Reactions  . Codeine Nausea And Vomiting  . Penicillins Rash    Has patient had a PCN reaction causing immediate rash, facial/tongue/throat swelling, SOB or lightheadedness with hypotension: No Has patient had a PCN reaction causing severe rash involving mucus membranes or skin necrosis: No Has patient had a PCN reaction that required hospitalization no Has patient had a PCN reaction occurring within the last 10 years: No If all of  the above answers are "NO", then may proceed with Cephalosporin use.     Prescriptions Prior to Admission  Medication Sig Dispense Refill Last Dose  . acetaminophen (TYLENOL) 500 MG tablet Take 1,000 mg by mouth every 6 (six) hours as needed.   07/25/2016 at Unknown time  . ibuprofen (ADVIL,MOTRIN) 800 MG tablet Take 1 tablet (800 mg total) by mouth every 8 (eight) hours as needed. 45 tablet 1 07/25/2016 at Unknown time  . labetalol (NORMODYNE) 200 MG tablet Take 200 mg by mouth 3 (three) times daily.   07/25/2016 at 1400  . sertraline (ZOLOFT) 50 MG tablet Take 1 tablet by mouth daily.   07/25/2016 at Unknown time  . diltiazem (CARDIZEM CD) 120 MG 24 hr capsule Take 1 capsule (120 mg total) by mouth daily. (Patient not taking: Reported on 07/21/2016) 30 capsule 3 Not Taking at Unknown time   Results for orders placed or performed during the hospital encounter of 07/25/16 (from the past 48 hour(s))  CBC     Status: Abnormal   Collection Time: 07/25/16  7:45 PM  Result Value Ref Range   WBC 9.7 4.0 - 10.5 K/uL   RBC 3.79 (L) 3.87 - 5.11 MIL/uL   Hemoglobin 11.6 (L) 12.0 -   15.0 g/dL   HCT 34.9 (L) 36.0 - 46.0 %   MCV 92.1 78.0 - 100.0 fL   MCH 30.6 26.0 - 34.0 pg   MCHC 33.2 30.0 - 36.0 g/dL   RDW 14.2 11.5 - 15.5 %   Platelets 274 150 - 400 K/uL  Comprehensive metabolic panel     Status: None   Collection Time: 07/25/16  7:45 PM  Result Value Ref Range   Sodium 137 135 - 145 mmol/L   Potassium 3.5 3.5 - 5.1 mmol/L   Chloride 101 101 - 111 mmol/L   CO2 27 22 - 32 mmol/L   Glucose, Bld 99 65 - 99 mg/dL   BUN 13 6 - 20 mg/dL   Creatinine, Ser 0.67 0.44 - 1.00 mg/dL   Calcium 9.0 8.9 - 10.3 mg/dL   Total Protein 7.1 6.5 - 8.1 g/dL   Albumin 3.6 3.5 - 5.0 g/dL   AST 23 15 - 41 U/L   ALT 17 14 - 54 U/L   Alkaline Phosphatase 94 38 - 126 U/L   Total Bilirubin 0.7 0.3 - 1.2 mg/dL   GFR calc non Af Amer >60 >60 mL/min   GFR calc Af Amer >60 >60 mL/min    Comment: (NOTE) The eGFR has  been calculated using the CKD EPI equation. This calculation has not been validated in all clinical situations. eGFR's persistently <60 mL/min signify possible Chronic Kidney Disease.    Anion gap 9 5 - 15  Protein / creatinine ratio, urine     Status: Abnormal   Collection Time: 07/25/16  8:05 PM  Result Value Ref Range   Creatinine, Urine 110.00 mg/dL   Total Protein, Urine 37 mg/dL    Comment: NO NORMAL RANGE ESTABLISHED FOR THIS TEST   Protein Creatinine Ratio 0.34 (H) 0.00 - 0.15 mg/mg[Cre]  Urinalysis, Routine w reflex microscopic     Status: Abnormal   Collection Time: 07/25/16  8:05 PM  Result Value Ref Range   Color, Urine YELLOW YELLOW   APPearance CLOUDY (A) CLEAR   Specific Gravity, Urine 1.017 1.005 - 1.030   pH 7.0 5.0 - 8.0   Glucose, UA NEGATIVE NEGATIVE mg/dL   Hgb urine dipstick LARGE (A) NEGATIVE   Bilirubin Urine NEGATIVE NEGATIVE   Ketones, ur NEGATIVE NEGATIVE mg/dL   Protein, ur 30 (A) NEGATIVE mg/dL   Nitrite NEGATIVE NEGATIVE   Leukocytes, UA MODERATE (A) NEGATIVE   RBC / HPF TOO NUMEROUS TO COUNT 0 - 5 RBC/hpf   WBC, UA 6-30 0 - 5 WBC/hpf   Bacteria, UA RARE (A) NONE SEEN   Squamous Epithelial / LPF 0-5 (A) NONE SEEN   Trans Epithel, UA 1    Mucous PRESENT   Protein / creatinine ratio, urine     Status: Abnormal   Collection Time: 07/25/16  8:05 PM  Result Value Ref Range   Creatinine, Urine 118.00 mg/dL   Total Protein, Urine 37 mg/dL    Comment: NO NORMAL RANGE ESTABLISHED FOR THIS TEST   Protein Creatinine Ratio 0.31 (H) 0.00 - 0.15 mg/mg[Cre]    Review of Systems  Constitutional: Negative for fever.  Eyes: Negative for photophobia and visual disturbance.  Neurological: Negative for headaches.  Psychiatric/Behavioral: The patient is nervous/anxious.    Physical Exam   Blood pressure (!) 160/90, pulse 70, temperature 98.5 F (36.9 C), temperature source Oral, resp. rate 16, height 5' 6" (1.676 m), weight 188 lb (85.3 kg), currently  breastfeeding.   Patient Vitals for the  past 24 hrs:  BP Temp Temp src Pulse Resp SpO2 Height Weight  07/25/16 2241 (!) 162/97 98.9 F (37.2 C) Oral 86 20 99 % - -  07/25/16 2230 (!) 164/94 - - 79 16 98 % - -  07/25/16 2207 (!) 164/96 - - 82 16 99 % - -  07/25/16 2202 (!) 172/97 - - 81 16 - - -  07/25/16 2158 (!) 164/107 - - 80 16 99 % - -  07/25/16 2148 (!) 161/89 - - 77 16 98 % - -  07/25/16 2138 (!) 168/95 - - 80 - - - -  07/25/16 2112 (!) 162/90 - - 71 18 - - -  07/25/16 2037 - - - 74 18 - - -  07/25/16 2036 (!) 169/91 - - - - - - -  07/25/16 2019 (!) 160/90 98.5 F (36.9 C) Oral 70 16 - - -  07/25/16 1948 (!) 171/100 - - 74 - - - -  07/25/16 1947 - 98.2 F (36.8 C) - - 18 - - -  07/25/16 1943 - - - - - - 5' 6" (1.676 m) 188 lb (85.3 kg)    Physical Exam  Constitutional: She is oriented to person, place, and time. She appears well-developed and well-nourished. No distress.  HENT:  Head: Normocephalic.  Eyes: Pupils are equal, round, and reactive to light.  Cardiovascular: Normal rate and normal heart sounds.   Respiratory: Effort normal and breath sounds normal.  GI: Soft. She exhibits no distension. There is no tenderness. There is no rebound.  Neurological: She is alert and oriented to person, place, and time.  Reflex Scores:      Patellar reflexes are 3+ on the right side and 3+ on the left side. Negative clonus   Skin: She is not diaphoretic.    MAU Course  Procedures  None  MDM  PIH labs ordered PCR ordered Norvasc 10 mg PO given Discussed patient with Dr. Terri Piedra. Admit for observation with magnesium infusion.  Patient upset during magnesium infusion. Cath urine needed. Patient would like to wait until she is on the floor to have cath done.  Lidocaine jelly applied.   Assessment and Plan   A:  1. Hypertension in pregnancy, pre-eclampsia, severe, delivered/postpartum   2. Hypertension in pregnancy, pre-eclampsia, severe, delivered     P:  Admit to  3rd floor per Dr. Terri Piedra Magnesium infusion Labetalol 20 mg IV now.   Lezlie Lye, NP 07/25/2016 11:18 PM

## 2016-07-25 NOTE — H&P (Signed)
Dawn Meadows is an 31 y.o. female on PPD#4 s/p svd. Pt has a history of chronic hypertension and was induced due to worsening BP despite being on labetalol 200mg  PO TID. After delivery pt was maintained on 100mg  po bid. After discharge, pt reports her BP has been increasing. She transitioned herself back to 200mg  PO TID today but BP has stayed in the 160-180s/100-110s despite rest and hydration. BP continued to be elevated in MAU. Pt denies associated headache or blurry vision. She has an anxiety disorder that is managed with zoloft. She is breastfeeding/pumping  Pertinent Gynecological History: Lochia: flow is light and with minimal cramping Sexually transmitted diseases: no past history Previous GYN Procedures: n/a  OB History: G2, P2002   Menstrual History: Menarche age: 43 No LMP recorded.    Past Medical History:  Diagnosis Date  . Anxiety    was on zoloft and effexor til 12/2010  . Gestational hypertension   . Headache(784.0)   . Other malaise and fatigue   . Panic attacks     Past Surgical History:  Procedure Laterality Date  . VAGINAL DELIVERY    . WISDOM TOOTH EXTRACTION      Family History  Problem Relation Age of Onset  . Breast cancer Maternal Grandmother   . Lung cancer Maternal Grandmother   . Hypotension Neg Hx   . Malignant hyperthermia Neg Hx   . Pseudochol deficiency Neg Hx   . Anesthesia problems Neg Hx     Social History:  reports that she has never smoked. She has never used smokeless tobacco. She reports that she does not drink alcohol or use drugs.  Allergies:  Allergies  Allergen Reactions  . Codeine Nausea And Vomiting  . Penicillins Rash    Has patient had a PCN reaction causing immediate rash, facial/tongue/throat swelling, SOB or lightheadedness with hypotension: No Has patient had a PCN reaction causing severe rash involving mucus membranes or skin necrosis: No Has patient had a PCN reaction that required hospitalization no Has patient  had a PCN reaction occurring within the last 10 years: No If all of the above answers are "NO", then may proceed with Cephalosporin use.     Prescriptions Prior to Admission  Medication Sig Dispense Refill Last Dose  . acetaminophen (TYLENOL) 500 MG tablet Take 1,000 mg by mouth every 6 (six) hours as needed.   07/25/2016 at Unknown time  . ibuprofen (ADVIL,MOTRIN) 800 MG tablet Take 1 tablet (800 mg total) by mouth every 8 (eight) hours as needed. 45 tablet 1 07/25/2016 at Unknown time  . labetalol (NORMODYNE) 200 MG tablet Take 200 mg by mouth 3 (three) times daily.   07/25/2016 at 1400  . sertraline (ZOLOFT) 50 MG tablet Take 1 tablet by mouth daily.   07/25/2016 at Unknown time  . diltiazem (CARDIZEM CD) 120 MG 24 hr capsule Take 1 capsule (120 mg total) by mouth daily. (Patient not taking: Reported on 07/21/2016) 30 capsule 3 Not Taking at Unknown time    Review of Systems  Constitutional: Negative for chills, fever, malaise/fatigue and weight loss.  Eyes: Negative for blurred vision and double vision.  Respiratory: Negative for shortness of breath.   Cardiovascular: Negative for chest pain, palpitations and leg swelling.  Gastrointestinal: Negative for abdominal pain, heartburn, nausea and vomiting.  Genitourinary: Positive for hematuria. Negative for dysuria.  Musculoskeletal: Negative for back pain and myalgias.  Skin: Negative for itching and rash.  Neurological: Negative for dizziness, tingling, sensory change, focal weakness, seizures and  headaches.  Endo/Heme/Allergies: Does not bruise/bleed easily.  Psychiatric/Behavioral: Negative for depression, hallucinations, substance abuse and suicidal ideas. The patient is nervous/anxious.     Blood pressure (!) 162/90, pulse 71, temperature 98.5 F (36.9 C), temperature source Oral, resp. rate 18, height 5\' 6"  (1.676 m), weight 188 lb (85.3 kg), currently breastfeeding. Physical Exam  Constitutional: She is oriented to person, place,  and time. She appears well-developed and well-nourished.  HENT:  Head: Normocephalic.  Neck: Normal range of motion.  Respiratory: Effort normal.  GI: Soft.  Genitourinary: Vagina normal and uterus normal.  Musculoskeletal: Normal range of motion.  Neurological: She is alert and oriented to person, place, and time.  Skin: Skin is warm and dry.  Psychiatric: She has a normal mood and affect. Her behavior is normal. Judgment and thought content normal.    Results for orders placed or performed during the hospital encounter of 07/25/16 (from the past 24 hour(s))  CBC     Status: Abnormal   Collection Time: 07/25/16  7:45 PM  Result Value Ref Range   WBC 9.7 4.0 - 10.5 K/uL   RBC 3.79 (L) 3.87 - 5.11 MIL/uL   Hemoglobin 11.6 (L) 12.0 - 15.0 g/dL   HCT 34.9 (L) 36.0 - 46.0 %   MCV 92.1 78.0 - 100.0 fL   MCH 30.6 26.0 - 34.0 pg   MCHC 33.2 30.0 - 36.0 g/dL   RDW 14.2 11.5 - 15.5 %   Platelets 274 150 - 400 K/uL  Comprehensive metabolic panel     Status: None   Collection Time: 07/25/16  7:45 PM  Result Value Ref Range   Sodium 137 135 - 145 mmol/L   Potassium 3.5 3.5 - 5.1 mmol/L   Chloride 101 101 - 111 mmol/L   CO2 27 22 - 32 mmol/L   Glucose, Bld 99 65 - 99 mg/dL   BUN 13 6 - 20 mg/dL   Creatinine, Ser 0.67 0.44 - 1.00 mg/dL   Calcium 9.0 8.9 - 10.3 mg/dL   Total Protein 7.1 6.5 - 8.1 g/dL   Albumin 3.6 3.5 - 5.0 g/dL   AST 23 15 - 41 U/L   ALT 17 14 - 54 U/L   Alkaline Phosphatase 94 38 - 126 U/L   Total Bilirubin 0.7 0.3 - 1.2 mg/dL   GFR calc non Af Amer >60 >60 mL/min   GFR calc Af Amer >60 >60 mL/min   Anion gap 9 5 - 15  Protein / creatinine ratio, urine     Status: Abnormal   Collection Time: 07/25/16  8:05 PM  Result Value Ref Range   Creatinine, Urine 110.00 mg/dL   Total Protein, Urine 37 mg/dL   Protein Creatinine Ratio 0.34 (H) 0.00 - 0.15 mg/mg[Cre]  Urinalysis, Routine w reflex microscopic     Status: Abnormal   Collection Time: 07/25/16  8:05 PM   Result Value Ref Range   Color, Urine YELLOW YELLOW   APPearance CLOUDY (A) CLEAR   Specific Gravity, Urine 1.017 1.005 - 1.030   pH 7.0 5.0 - 8.0   Glucose, UA NEGATIVE NEGATIVE mg/dL   Hgb urine dipstick LARGE (A) NEGATIVE   Bilirubin Urine NEGATIVE NEGATIVE   Ketones, ur NEGATIVE NEGATIVE mg/dL   Protein, ur 30 (A) NEGATIVE mg/dL   Nitrite NEGATIVE NEGATIVE   Leukocytes, UA MODERATE (A) NEGATIVE   RBC / HPF TOO NUMEROUS TO COUNT 0 - 5 RBC/hpf   WBC, UA 6-30 0 - 5 WBC/hpf   Bacteria,  UA RARE (A) NONE SEEN   Squamous Epithelial / LPF 0-5 (A) NONE SEEN   Trans Epithel, UA 1    Mucous PRESENT     No results found.  Assessment/Plan: [id number] on PPD#4 s/p svd with hx of cHTN with severe range BPs Admit for overnight observation/mgmt of BP Start on MgSO4 for seizure prophylaxis IV labetalol now then transition to procardia XL PReE labs pending   Sherlyn Hay 07/25/2016, 9:36 PM

## 2016-07-26 DIAGNOSIS — F419 Anxiety disorder, unspecified: Secondary | ICD-10-CM | POA: Diagnosis present

## 2016-07-26 DIAGNOSIS — O99345 Other mental disorders complicating the puerperium: Secondary | ICD-10-CM | POA: Diagnosis present

## 2016-07-26 DIAGNOSIS — O115 Pre-existing hypertension with pre-eclampsia, complicating the puerperium: Secondary | ICD-10-CM | POA: Diagnosis present

## 2016-07-26 DIAGNOSIS — O1003 Pre-existing essential hypertension complicating the puerperium: Secondary | ICD-10-CM | POA: Diagnosis present

## 2016-07-26 LAB — HEPATIC FUNCTION PANEL
ALK PHOS: 83 U/L (ref 38–126)
ALT: 17 U/L (ref 14–54)
AST: 22 U/L (ref 15–41)
Albumin: 3.8 g/dL (ref 3.5–5.0)
Bilirubin, Direct: <0.1 mg/dL — ABNORMAL LOW (ref 0.1–0.5)
Total Bilirubin: 0.6 mg/dL (ref 0.3–1.2)
Total Protein: 8 g/dL (ref 6.5–8.1)

## 2016-07-26 LAB — PROTEIN / CREATININE RATIO, URINE
CREATININE, URINE: 23 mg/dL
Total Protein, Urine: <6 mg/dL

## 2016-07-26 LAB — CBC
HCT: 36 % (ref 36.0–46.0)
Hemoglobin: 11.7 g/dL — ABNORMAL LOW (ref 12.0–15.0)
MCH: 29.9 pg (ref 26.0–34.0)
MCHC: 32.5 g/dL (ref 30.0–36.0)
MCV: 92.1 fL (ref 78.0–100.0)
PLATELETS: 271 10*3/uL (ref 150–400)
RBC: 3.91 MIL/uL (ref 3.87–5.11)
RDW: 14.2 % (ref 11.5–15.5)
WBC: 11.4 10*3/uL — ABNORMAL HIGH (ref 4.0–10.5)

## 2016-07-26 MED ORDER — NIFEDIPINE ER OSMOTIC RELEASE 30 MG PO TB24
60.0000 mg | ORAL_TABLET | Freq: Every day | ORAL | Status: DC
  Administered 2016-07-26 – 2016-07-27 (×2): 60 mg via ORAL
  Filled 2016-07-26 (×2): qty 2

## 2016-07-26 NOTE — Progress Notes (Signed)
Patient ID: Dawn Meadows, female   DOB: 06/08/1985, 31 y.o.   MRN: [id number]

## 2016-07-26 NOTE — Progress Notes (Signed)
Patient ID: Dawn Meadows, female   DOB: Nov 28, 1985, 31 y.o.   MRN: [id number] Pt doing well. Still has a mild headache but attributes to lack of sleep. Denies any blurry vision or scotomata. Tolerating MgSO4.. Baby in room and pt happy able to breastfeed/pump VS - 125/82 ( 125-141/81-105) GEN - NAD, in good spirits ABD- soft, NT EXT - no homans  Labs: 11.4>11.7<271; lfts pending  A/P: PPD#5 post svd; severe range BP now under better control on MgSO4 S/p 20mg  labetalol iv x 1; norvasc 10mg ; labetalol 200mg  before midnight Will transition to procardia 60xl ( fr labetalol 200mg  tid) Continue to monitor

## 2016-07-26 NOTE — Progress Notes (Signed)
Patient ID: Dawn Meadows, female   DOB: 06/17/1985, 31 y.o.   MRN: [id number] MgSO4 stopped at 2200 BP stable 120-140/70-86  Continue on procardia 60xl daily Likely discharge to home in am

## 2016-07-26 NOTE — Progress Notes (Signed)
Patient ID: Dawn Meadows, female   DOB: [birth year], 31 y.o.   MRN: [id number]  BP still stable 133-140/85-86  Continue current mgmt

## 2016-07-26 NOTE — Progress Notes (Signed)
Pt awoke from sleep shaking, pt reports when she feels anxious she does this.   Mom at bedside comforting pt.  Emotionally supported pt offered warm blanket.  Shaking sub sided after 10 mins.

## 2016-07-27 MED ORDER — NIFEDIPINE ER 60 MG PO TB24: 60.0000 mg | ORAL_TABLET | Freq: Every day | ORAL | 1 refills | Status: DC

## 2016-07-27 NOTE — Discharge Summary (Signed)
Physician Discharge Summary  Patient ID: Dawn Meadows MRN: [id number] DOB/AGE: 1985-11-14 31 y.o.  Admit date: 07/25/2016 Discharge date: 07/27/2016  Admission Diagnoses:  Discharge Diagnoses:  Active Problems:   Preeclampsia in postpartum period   Discharged Condition: good  Hospital Course: Pt admitted with elevated BP postpartum to 160-170/100-110 and placed on magnesium for 24 hours.  Labs were WNL and protein:creatinine ration 0.31 After d/c magnesium she was stable on procardia XL 60mg  and BP 130-140/70-90 range.  She was asymptomatic and diuresing well.    Significant Diagnostic Studies: labs: cbc, cmet  Treatments: IV magnesium and antihypertensives  Discharge Exam: Blood pressure (!) 147/90, pulse 82, temperature 98.7 F (37.1 C), temperature source Oral, resp. rate 18, height 5\' 6"  (1.676 m), weight 85.3 kg (188 lb), SpO2 99 %, currently breastfeeding. General appearance: alert and cooperative GI: soft NT  Disposition: 01-Home or Self Care  Discharge Instructions    Diet - low sodium heart healthy    Complete by:  As directed    Discharge instructions    Complete by:  As directed    Nothing in vagina for 5 weeks.  No sex, tampons, and douching.  Call for severe HA or persistent BP higher than 150/100   Increase activity slowly    Complete by:  As directed      Allergies as of 07/27/2016      Reactions   Codeine Nausea And Vomiting   Penicillins Rash   Has patient had a PCN reaction causing immediate rash, facial/tongue/throat swelling, SOB or lightheadedness with hypotension: No Has patient had a PCN reaction causing severe rash involving mucus membranes or skin necrosis: No Has patient had a PCN reaction that required hospitalization no Has patient had a PCN reaction occurring within the last 10 years: No If all of the above answers are "NO", then may proceed with Cephalosporin use.      Medication List    STOP taking these medications   labetalol 200  MG tablet Commonly known as:  NORMODYNE   pantoprazole 40 MG tablet Commonly known as:  PROTONIX     TAKE these medications   acetaminophen 500 MG tablet Commonly known as:  TYLENOL Take 1,000 mg by mouth every 6 (six) hours as needed for mild pain or headache.   ibuprofen 800 MG tablet Commonly known as:  ADVIL,MOTRIN Take 1 tablet (800 mg total) by mouth every 8 (eight) hours as needed. What changed:  reasons to take this   NIFEdipine 60 MG 24 hr tablet Commonly known as:  PROCARDIA-XL/ADALAT CC Take 1 tablet (60 mg total) by mouth daily.   prenatal multivitamin Tabs tablet Take 1 tablet by mouth daily at 12 noon.   sertraline 50 MG tablet Commonly known as:  ZOLOFT Take 1 tablet by mouth daily.      Follow-up Information    Logan Bores, MD. Schedule an appointment as soon as possible for a visit in 1 week(s).   Specialty:  Obstetrics and Gynecology Why:  BP check Contact information: [address] [place] [address] [phone]           Signed: Logan Bores 07/27/2016, 9:24 AM

## 2016-07-27 NOTE — Progress Notes (Signed)
Patient ID: Dawn Meadows, female   DOB: [birth year], 31 y.o.   MRN: [id number] PPD #6 pp hypertension Pt feeling good this AM.  No HA, no other sx BP stable off magnesium and on Procardia XL 30mg   Excellent diuresis since admission All labs WNL this AM  Will d/c home on procardia f/u in office in one week for BP check

## 2016-07-27 NOTE — Progress Notes (Signed)
Patient ready for discharge she was educated on preeclampsia sighs and symptoms.

## 2016-08-03 ENCOUNTER — Inpatient Hospital Stay (HOSPITAL_COMMUNITY): Admission: AD | Admit: 2016-08-03 | Payer: 59 | Source: Ambulatory Visit | Admitting: Obstetrics and Gynecology

## 2016-08-14 ENCOUNTER — Inpatient Hospital Stay (HOSPITAL_COMMUNITY)
Admission: AD | Admit: 2016-08-14 | Discharge: 2016-08-14 | Disposition: A | Discharge status: Home / Self Care | Payer: Commercial Managed Care - HMO | Source: Ambulatory Visit | Attending: Obstetrics and Gynecology | Admitting: Obstetrics and Gynecology

## 2016-08-14 ENCOUNTER — Encounter (HOSPITAL_COMMUNITY): Payer: Self-pay

## 2016-08-14 DIAGNOSIS — O165 Unspecified maternal hypertension, complicating the puerperium: Secondary | ICD-10-CM

## 2016-08-14 DIAGNOSIS — O1093 Unspecified pre-existing hypertension complicating the puerperium: Secondary | ICD-10-CM | POA: Diagnosis not present

## 2016-08-14 DIAGNOSIS — Z885 Allergy status to narcotic agent status: Secondary | ICD-10-CM | POA: Diagnosis not present

## 2016-08-14 DIAGNOSIS — Z880 Allergy status to penicillin: Secondary | ICD-10-CM | POA: Diagnosis not present

## 2016-08-14 DIAGNOSIS — I10 Essential (primary) hypertension: Secondary | ICD-10-CM | POA: Diagnosis present

## 2016-08-14 DIAGNOSIS — Z888 Allergy status to other drugs, medicaments and biological substances status: Secondary | ICD-10-CM | POA: Diagnosis not present

## 2016-08-14 DIAGNOSIS — Z79899 Other long term (current) drug therapy: Secondary | ICD-10-CM | POA: Insufficient documentation

## 2016-08-14 LAB — URINALYSIS, ROUTINE W REFLEX MICROSCOPIC
BACTERIA UA: NONE SEEN
BILIRUBIN URINE: NEGATIVE
GLUCOSE, UA: NEGATIVE mg/dL
Ketones, ur: NEGATIVE mg/dL
Leukocytes, UA: NEGATIVE
Nitrite: NEGATIVE
PROTEIN: NEGATIVE mg/dL
Specific Gravity, Urine: 1.009 (ref 1.005–1.030)
pH: 6 (ref 5.0–8.0)

## 2016-08-14 LAB — CBC
HEMATOCRIT: 41 % (ref 36.0–46.0)
HEMOGLOBIN: 13.4 g/dL (ref 12.0–15.0)
MCH: 29.7 pg (ref 26.0–34.0)
MCHC: 32.7 g/dL (ref 30.0–36.0)
MCV: 90.9 fL (ref 78.0–100.0)
Platelets: 333 10*3/uL (ref 150–400)
RBC: 4.51 MIL/uL (ref 3.87–5.11)
RDW: 13 % (ref 11.5–15.5)
WBC: 14.1 10*3/uL — ABNORMAL HIGH (ref 4.0–10.5)

## 2016-08-14 LAB — COMPREHENSIVE METABOLIC PANEL
ALBUMIN: 4.8 g/dL (ref 3.5–5.0)
ALK PHOS: 83 U/L (ref 38–126)
ALT: 19 U/L (ref 14–54)
AST: 23 U/L (ref 15–41)
Anion gap: 11 (ref 5–15)
BILIRUBIN TOTAL: 0.7 mg/dL (ref 0.3–1.2)
BUN: 21 mg/dL — ABNORMAL HIGH (ref 6–20)
CALCIUM: 9.5 mg/dL (ref 8.9–10.3)
CO2: 26 mmol/L (ref 22–32)
Chloride: 99 mmol/L — ABNORMAL LOW (ref 101–111)
Creatinine, Ser: 1.42 mg/dL — ABNORMAL HIGH (ref 0.44–1.00)
GFR calc Af Amer: 56 mL/min — ABNORMAL LOW (ref >60)
GFR calc non Af Amer: 48 mL/min — ABNORMAL LOW (ref >60)
Glucose, Bld: 97 mg/dL (ref 65–99)
POTASSIUM: 4 mmol/L (ref 3.5–5.1)
Sodium: 136 mmol/L (ref 135–145)
TOTAL PROTEIN: 8.6 g/dL — AB (ref 6.5–8.1)

## 2016-08-14 LAB — PROTEIN / CREATININE RATIO, URINE
Creatinine, Urine: 49 mg/dL
Protein Creatinine Ratio: 0.24 mg/mg{Cre} — ABNORMAL HIGH (ref 0.00–0.15)
Total Protein, Urine: 12 mg/dL

## 2016-08-14 MED ORDER — OLMESARTAN MEDOXOMIL 20 MG PO TABS: 30.0000 mg | ORAL_TABLET | Freq: Every day | ORAL | 0 refills | Status: DC

## 2016-08-14 MED ORDER — LABETALOL HCL 100 MG PO TABS
200.0000 mg | ORAL_TABLET | Freq: Once | ORAL | Status: AC
  Administered 2016-08-14: 200 mg via ORAL
  Filled 2016-08-14: qty 2

## 2016-08-14 NOTE — MAU Note (Signed)
B/P has been up today when I took it and then at Dr Richardson's today.  Saw doctor today because was high. Yesterday changed Labetalol from 100mg  BID to 200mg  BID which I started yesterday. No headaches or visual changes. Scant vag spotting on occ. Not breastfeeding.

## 2016-08-14 NOTE — Discharge Instructions (Signed)
Continue labetalol 200 mg twice daily -- (you have already received your PM dose in MAU tonight). Beginning tomorrow, increase Benicar to 30 mg daily (1.5 tablets). Call Dr. Willis Modena tomorrow afternoon to discuss your BP reading.        Hypertension Hypertension, commonly called high blood pressure, is when the force of blood pumping through the arteries is too strong. The arteries are the blood vessels that carry blood from the heart throughout the body. Hypertension forces the heart to work harder to pump blood and may cause arteries to become narrow or stiff. Having untreated or uncontrolled hypertension can cause heart attacks, strokes, kidney disease, and other problems. A blood pressure reading consists of a higher number over a lower number. Ideally, your blood pressure should be below 120/80. The first ("top") number is called the systolic pressure. It is a measure of the pressure in your arteries as your heart beats. The second ("bottom") number is called the diastolic pressure. It is a measure of the pressure in your arteries as the heart relaxes. What are the causes? The cause of this condition is not known. What increases the risk? Some risk factors for high blood pressure are under your control. Others are not. Factors you can change   Smoking.  Having type 2 diabetes mellitus, high cholesterol, or both.  Not getting enough exercise or physical activity.  Being overweight.  Having too much fat, sugar, calories, or salt (sodium) in your diet.  Drinking too much alcohol. Factors that are difficult or impossible to change   Having chronic kidney disease.  Having a family history of high blood pressure.  Age. Risk increases with age.  Race. You may be at higher risk if you are African-American.  Gender. Men are at higher risk than women before age 47. After age 1, women are at higher risk than men.  Having obstructive sleep apnea.  Stress. What are the signs or  symptoms? Extremely high blood pressure (hypertensive crisis) may cause:  Headache.  Anxiety.  Shortness of breath.  Nosebleed.  Nausea and vomiting.  Severe chest pain.  Jerky movements you cannot control (seizures). How is this diagnosed? This condition is diagnosed by measuring your blood pressure while you are seated, with your arm resting on a surface. The cuff of the blood pressure monitor will be placed directly against the skin of your upper arm at the level of your heart. It should be measured at least twice using the same arm. Certain conditions can cause a difference in blood pressure between your right and left arms. Certain factors can cause blood pressure readings to be lower or higher than normal (elevated) for a short period of time:  When your blood pressure is higher when you are in a health care provider's office than when you are at home, this is called white coat hypertension. Most people with this condition do not need medicines.  When your blood pressure is higher at home than when you are in a health care provider's office, this is called masked hypertension. Most people with this condition may need medicines to control blood pressure. If you have a high blood pressure reading during one visit or you have normal blood pressure with other risk factors:  You may be asked to return on a different day to have your blood pressure checked again.  You may be asked to monitor your blood pressure at home for 1 week or longer. If you are diagnosed with hypertension, you may have other  blood or imaging tests to help your health care provider understand your overall risk for other conditions. How is this treated? This condition is treated by making healthy lifestyle changes, such as eating healthy foods, exercising more, and reducing your alcohol intake. Your health care provider may prescribe medicine if lifestyle changes are not enough to get your blood pressure under  control, and if:  Your systolic blood pressure is above 130.  Your diastolic blood pressure is above 80. Your personal target blood pressure may vary depending on your medical conditions, your age, and other factors. Follow these instructions at home: Eating and drinking   Eat a diet that is high in fiber and potassium, and low in sodium, added sugar, and fat. An example eating plan is called the DASH (Dietary Approaches to Stop Hypertension) diet. To eat this way:  Eat plenty of fresh fruits and vegetables. Try to fill half of your plate at each meal with fruits and vegetables.  Eat whole grains, such as whole wheat pasta, brown rice, or whole grain bread. Fill about one quarter of your plate with whole grains.  Eat or drink low-fat dairy products, such as skim milk or low-fat yogurt.  Avoid fatty cuts of meat, processed or cured meats, and poultry with skin. Fill about one quarter of your plate with lean proteins, such as fish, chicken without skin, beans, eggs, and tofu.  Avoid premade and processed foods. These tend to be higher in sodium, added sugar, and fat.  Reduce your daily sodium intake. Most people with hypertension should eat less than 1,500 mg of sodium a day.  Limit alcohol intake to no more than 1 drink a day for nonpregnant women and 2 drinks a day for men. One drink equals 12 oz of beer, 5 oz of wine, or 1 oz of hard liquor. Lifestyle   Work with your health care provider to maintain a healthy body weight or to lose weight. Ask what an ideal weight is for you.  Get at least 30 minutes of exercise that causes your heart to beat faster (aerobic exercise) most days of the week. Activities may include walking, swimming, or biking.  Include exercise to strengthen your muscles (resistance exercise), such as pilates or lifting weights, as part of your weekly exercise routine. Try to do these types of exercises for 30 minutes at least 3 days a week.  Do not use any products  that contain nicotine or tobacco, such as cigarettes and e-cigarettes. If you need help quitting, ask your health care provider.  Monitor your blood pressure at home as told by your health care provider.  Keep all follow-up visits as told by your health care provider. This is important. Medicines   Take over-the-counter and prescription medicines only as told by your health care provider. Follow directions carefully. Blood pressure medicines must be taken as prescribed.  Do not skip doses of blood pressure medicine. Doing this puts you at risk for problems and can make the medicine less effective.  Ask your health care provider about side effects or reactions to medicines that you should watch for. Contact a health care provider if:  You think you are having a reaction to a medicine you are taking.  You have headaches that keep coming back (recurring).  You feel dizzy.  You have swelling in your ankles.  You have trouble with your vision. Get help right away if:  You develop a severe headache or confusion.  You have unusual weakness or  numbness.  You feel faint.  You have severe pain in your chest or abdomen.  You vomit repeatedly.  You have trouble breathing. Summary  Hypertension is when the force of blood pumping through your arteries is too strong. If this condition is not controlled, it may put you at risk for serious complications.  Your personal target blood pressure may vary depending on your medical conditions, your age, and other factors. For most people, a normal blood pressure is less than 120/80.  Hypertension is treated with lifestyle changes, medicines, or a combination of both. Lifestyle changes include weight loss, eating a healthy, low-sodium diet, exercising more, and limiting alcohol. This information is not intended to replace advice given to you by your health care provider. Make sure you discuss any questions you have with your health care  provider. Document Released: 04/27/2005 Document Revised: 03/25/2016 Document Reviewed: 03/25/2016 Elsevier Interactive Patient Education  2017 [employer].

## 2016-08-14 NOTE — MAU Provider Note (Signed)
History     CSN: [id number]  Arrival date and time: 08/14/16 2947   First Provider Initiated Contact with Patient 08/14/16 2047      Chief Complaint  Patient presents with  . Hypertension   HPI  Dawn Meadows is a 31 y.o. [id number] female who presents for hypertension. Hx of chronic hypertension. Patient is 3 weeks s/p SVD. Was readmitted on PP day 4 for severe range BPs with elevated PCR & received 24 hrs mag sulfate. Patient was discharged home on procardia but had to stop taking it d/t allergic reaction. Is currently taking benicar 20 mg QD & labetalol 200 mg BID (which was just increased from 100 bid yesterday). Was seen by Dr. Marvel Plan today & reportedly had BP 140s/90s; labs were drawn & creatinine was elevated. Pt states after returning home her BP was 190s-200s/100s. States her husband is a IT trainer; he took her BP with a manual cuff & confirmed the elevation.  Patient reports intermittent headaches that she has been treating with tylenol; denies headache today. Denies chest pain, visual disturbance, epigastric pain, SOB, or n/v. Last took meds this morning; has not taken evening labetalol dose yet.   OB History    Gravida Para Term Preterm AB Living   2 2 2  0 0 2   SAB TAB Ectopic Multiple Live Births   0 0 0 0 2      Past Medical History:  Diagnosis Date  . Anxiety    was on zoloft and effexor til 12/2010  . Gestational hypertension   . Headache(784.0)   . Other malaise and fatigue   . Panic attacks     Past Surgical History:  Procedure Laterality Date  . VAGINAL DELIVERY    . WISDOM TOOTH EXTRACTION      Family History  Problem Relation Age of Onset  . Breast cancer Maternal Grandmother   . Lung cancer Maternal Grandmother   . Hypotension Neg Hx   . Malignant hyperthermia Neg Hx   . Pseudochol deficiency Neg Hx   . Anesthesia problems Neg Hx     Social History  Substance Use Topics  . Smoking status: Never Smoker  . Smokeless tobacco: Never Used   . Alcohol use No    Allergies:  Allergies  Allergen Reactions  . Codeine Nausea And Vomiting  . Penicillins Rash    Has patient had a PCN reaction causing immediate rash, facial/tongue/throat swelling, SOB or lightheadedness with hypotension: No Has patient had a PCN reaction causing severe rash involving mucus membranes or skin necrosis: No Has patient had a PCN reaction that required hospitalization no Has patient had a PCN reaction occurring within the last 10 years: No If all of the above answers are "NO", then may proceed with Cephalosporin use.   . Procardia [Nifedipine] Rash    Prescriptions Prior to Admission  Medication Sig Dispense Refill Last Dose  . aspirin-acetaminophen-caffeine (EXCEDRIN MIGRAINE) 250-250-65 MG tablet Take by mouth every 6 (six) hours as needed for headache.   08/14/2016 at 1400  . diphenhydrAMINE (BENADRYL) 2 % cream Apply topically 3 (three) times daily as needed for itching.     . labetalol (NORMODYNE) 200 MG tablet Take 200 mg by mouth.   08/14/2016 at 0930  . olmesartan (BENICAR) 20 MG tablet Take 20 mg by mouth daily.   08/14/2016 at 0930  . sertraline (ZOLOFT) 50 MG tablet Take 1 tablet by mouth daily.   08/14/2016 at Unknown time  . acetaminophen (TYLENOL) 500 MG  tablet Take 1,000 mg by mouth every 6 (six) hours as needed for mild pain or headache.    07/25/2016 at Unknown time  . ibuprofen (ADVIL,MOTRIN) 800 MG tablet Take 1 tablet (800 mg total) by mouth every 8 (eight) hours as needed. (Patient taking differently: Take 800 mg by mouth every 8 (eight) hours as needed for headache or mild pain. ) 45 tablet 1 07/25/2016 at Unknown time  . Prenatal Vit-Fe Fumarate-FA (PRENATAL MULTIVITAMIN) TABS tablet Take 1 tablet by mouth daily at 12 noon.   07/25/2016 at Unknown time  . [DISCONTINUED] NIFEdipine (PROCARDIA-XL/ADALAT CC) 60 MG 24 hr tablet Take 1 tablet (60 mg total) by mouth daily. 30 tablet 1     Review of Systems  Constitutional: Negative.   Eyes:  Negative for visual disturbance.  Respiratory: Negative for shortness of breath.   Cardiovascular: Negative for chest pain and leg swelling.  Gastrointestinal: Negative.   Genitourinary: Negative for dysuria, flank pain and hematuria.  Neurological: Positive for headaches. Negative for dizziness.   Physical Exam   Blood pressure (!) 144/94, pulse 69, temperature 97.8 F (36.6 C), resp. rate 18, height 5\' 6"  (1.676 m), weight 176 lb (79.8 kg), SpO2 98 %, currently breastfeeding.  Temp:  [97.8 F (36.6 C)] 97.8 F (36.6 C) (04/06 2010) Pulse Rate:  [65-71] 69 (04/06 2145) Resp:  [18] 18 (04/06 2010) BP: (144-176)/(88-109) 144/94 (04/06 2145) SpO2:  [98 %] 98 % (04/06 2012) Weight:  [176 lb (79.8 kg)] 176 lb (79.8 kg) (04/06 2010)   Physical Exam  Nursing note and vitals reviewed. Constitutional: She is oriented to person, place, and time. She appears well-developed and well-nourished. No distress.  HENT:  Head: Normocephalic and atraumatic.  Eyes: Conjunctivae are normal. Right eye exhibits no discharge. Left eye exhibits no discharge. No scleral icterus.  Neck: Normal range of motion.  Cardiovascular: Normal rate, regular rhythm, normal heart sounds and intact distal pulses.   No murmur heard. Respiratory: Effort normal and breath sounds normal. No respiratory distress. She has no wheezes.  GI: Soft. Bowel sounds are normal. She exhibits no distension. There is no tenderness.  Musculoskeletal: She exhibits no edema.  Neurological: She is alert and oriented to person, place, and time.  Reflex Scores:      Patellar reflexes are 3+ on the right side and 3+ on the left side. No clonus  Skin: Skin is warm and dry. She is not diaphoretic.  Psychiatric: She has a normal mood and affect. Her behavior is normal. Judgment and thought content normal.    MAU Course  Procedures Results for orders placed or performed during the hospital encounter of 08/14/16 (from the past 24 hour(s))   Protein / creatinine ratio, urine     Status: Abnormal   Collection Time: 08/14/16  8:17 PM  Result Value Ref Range   Creatinine, Urine 49.00 mg/dL   Total Protein, Urine 12 mg/dL   Protein Creatinine Ratio 0.24 (H) 0.00 - 0.15 mg/mg[Cre]  Urinalysis, Routine w reflex microscopic     Status: Abnormal   Collection Time: 08/14/16  8:17 PM  Result Value Ref Range   Color, Urine STRAW (A) YELLOW   APPearance CLEAR CLEAR   Specific Gravity, Urine 1.009 1.005 - 1.030   pH 6.0 5.0 - 8.0   Glucose, UA NEGATIVE NEGATIVE mg/dL   Hgb urine dipstick SMALL (A) NEGATIVE   Bilirubin Urine NEGATIVE NEGATIVE   Ketones, ur NEGATIVE NEGATIVE mg/dL   Protein, ur NEGATIVE NEGATIVE mg/dL  Nitrite NEGATIVE NEGATIVE   Leukocytes, UA NEGATIVE NEGATIVE   RBC / HPF 6-30 0 - 5 RBC/hpf   WBC, UA 6-30 0 - 5 WBC/hpf   Bacteria, UA NONE SEEN NONE SEEN   Squamous Epithelial / LPF 0-5 (A) NONE SEEN   Mucous PRESENT   CBC     Status: Abnormal   Collection Time: 08/14/16  8:19 PM  Result Value Ref Range   WBC 14.1 (H) 4.0 - 10.5 K/uL   RBC 4.51 3.87 - 5.11 MIL/uL   Hemoglobin 13.4 12.0 - 15.0 g/dL   HCT 41.0 36.0 - 46.0 %   MCV 90.9 78.0 - 100.0 fL   MCH 29.7 26.0 - 34.0 pg   MCHC 32.7 30.0 - 36.0 g/dL   RDW 13.0 11.5 - 15.5 %   Platelets 333 150 - 400 K/uL  Comprehensive metabolic panel     Status: Abnormal   Collection Time: 08/14/16  8:19 PM  Result Value Ref Range   Sodium 136 135 - 145 mmol/L   Potassium 4.0 3.5 - 5.1 mmol/L   Chloride 99 (L) 101 - 111 mmol/L   CO2 26 22 - 32 mmol/L   Glucose, Bld 97 65 - 99 mg/dL   BUN 21 (H) 6 - 20 mg/dL   Creatinine, Ser 1.42 (H) 0.44 - 1.00 mg/dL   Calcium 9.5 8.9 - 10.3 mg/dL   Total Protein 8.6 (H) 6.5 - 8.1 g/dL   Albumin 4.8 3.5 - 5.0 g/dL   AST 23 15 - 41 U/L   ALT 19 14 - 54 U/L   Alkaline Phosphatase 83 38 - 126 U/L   Total Bilirubin 0.7 0.3 - 1.2 mg/dL   GFR calc non Af Amer 48 (L) >60 mL/min   GFR calc Af Amer 56 (L) >60 mL/min   Anion gap 11  5 - 15    MDM CBC, CMP, urine PCR Labetalol 200 mg PO BP responded to labetalol & is no longer in severe range S/w Dr. Willis Modena; informed of BPs, labs, & treatment. Ok to discharge home. Will increase benicar to 30 mg daily. Patient to call him tomorrow after checking her BP.   Assessment and Plan  A: 1. Postpartum hypertension    P: Discharge home Increase benicar to 30 mg daily. Continue labetalol 200 mg BID (beginning tomorrow as she has received her evening dose in MAU) Call Dr. Willis Modena tomorrow after checking BP Discussed reasons to return to [place] 08/14/2016, 8:47 PM

## 2016-09-16 ENCOUNTER — Other Ambulatory Visit: Payer: Self-pay | Admitting: Urology

## 2016-09-17 ENCOUNTER — Other Ambulatory Visit: Payer: Self-pay | Admitting: Student

## 2016-09-25 DIAGNOSIS — N200 Calculus of kidney: Secondary | ICD-10-CM | POA: Insufficient documentation

## 2016-09-30 ENCOUNTER — Encounter (HOSPITAL_BASED_OUTPATIENT_CLINIC_OR_DEPARTMENT_OTHER): Payer: Self-pay | Admitting: *Deleted

## 2016-09-30 NOTE — Progress Notes (Signed)
NPO AFTER MN.  ARRIVE AT 1015.  NEEDS ISTAT 8, EKG, AND URINE PREG.  WILL TAKE LABETALOL, BENICAR, AND PRILOSEC AM DOS W/ SIPS OF WATER AND IF NEEDED TAKE TYLENOL.

## 2016-10-09 ENCOUNTER — Ambulatory Visit (HOSPITAL_BASED_OUTPATIENT_CLINIC_OR_DEPARTMENT_OTHER): Payer: Commercial Managed Care - HMO | Admitting: Certified Registered"

## 2016-10-09 ENCOUNTER — Other Ambulatory Visit: Payer: Self-pay

## 2016-10-09 ENCOUNTER — Ambulatory Visit (HOSPITAL_BASED_OUTPATIENT_CLINIC_OR_DEPARTMENT_OTHER)
Admission: RE | Admit: 2016-10-09 | Discharge: 2016-10-09 | Disposition: A | Discharge status: Home / Self Care | Payer: Commercial Managed Care - HMO | Source: Ambulatory Visit | Attending: Urology | Admitting: Urology

## 2016-10-09 ENCOUNTER — Encounter (HOSPITAL_BASED_OUTPATIENT_CLINIC_OR_DEPARTMENT_OTHER): Payer: Self-pay

## 2016-10-09 ENCOUNTER — Encounter (HOSPITAL_BASED_OUTPATIENT_CLINIC_OR_DEPARTMENT_OTHER)
Admission: RE | Disposition: A | Discharge status: Home / Self Care | Payer: Self-pay | Source: Ambulatory Visit | Attending: Urology

## 2016-10-09 DIAGNOSIS — N200 Calculus of kidney: Secondary | ICD-10-CM | POA: Insufficient documentation

## 2016-10-09 DIAGNOSIS — I10 Essential (primary) hypertension: Secondary | ICD-10-CM | POA: Diagnosis not present

## 2016-10-09 HISTORY — DX: Personal history of other mental and behavioral disorders: Z86.59

## 2016-10-09 HISTORY — DX: Nausea with vomiting, unspecified: R11.2

## 2016-10-09 HISTORY — DX: Hematuria, unspecified: R31.9

## 2016-10-09 HISTORY — DX: Personal history of other specified conditions: Z87.898

## 2016-10-09 HISTORY — DX: Dermatitis, unspecified: L30.9

## 2016-10-09 HISTORY — DX: Essential (primary) hypertension: I10

## 2016-10-09 HISTORY — DX: Personal history of other complications of pregnancy, childbirth and the puerperium: Z87.59

## 2016-10-09 HISTORY — DX: Personal history of urinary calculi: Z87.442

## 2016-10-09 HISTORY — DX: Unspecified maternal hypertension, complicating the puerperium: O16.5

## 2016-10-09 HISTORY — DX: Other specified postprocedural states: Z98.890

## 2016-10-09 HISTORY — DX: Gastro-esophageal reflux disease without esophagitis: K21.9

## 2016-10-09 HISTORY — PX: HOLMIUM LASER APPLICATION: SHX5852

## 2016-10-09 HISTORY — PX: CYSTOSCOPY WITH RETROGRADE PYELOGRAM, URETEROSCOPY AND STENT PLACEMENT: SHX5789

## 2016-10-09 LAB — POCT I-STAT, CHEM 8
BUN: 19 mg/dL (ref 6–20)
CREATININE: 1.1 mg/dL — AB (ref 0.44–1.00)
Calcium, Ion: 1.26 mmol/L (ref 1.15–1.40)
Chloride: 103 mmol/L (ref 101–111)
Glucose, Bld: 110 mg/dL — ABNORMAL HIGH (ref 65–99)
HEMATOCRIT: 37 % (ref 36.0–46.0)
Hemoglobin: 12.6 g/dL (ref 12.0–15.0)
Potassium: 4.1 mmol/L (ref 3.5–5.1)
Sodium: 139 mmol/L (ref 135–145)
TCO2: 24 mmol/L (ref 0–100)

## 2016-10-09 LAB — POCT PREGNANCY, URINE: PREG TEST UR: NEGATIVE

## 2016-10-09 SURGERY — CYSTOURETEROSCOPY, WITH RETROGRADE PYELOGRAM AND STENT INSERTION
Anesthesia: General | Site: Renal | Laterality: Bilateral

## 2016-10-09 MED ORDER — LIDOCAINE 2% (20 MG/ML) 5 ML SYRINGE
INTRAMUSCULAR | Status: AC
  Filled 2016-10-09: qty 5

## 2016-10-09 MED ORDER — LIDOCAINE 2% (20 MG/ML) 5 ML SYRINGE
INTRAMUSCULAR | Status: DC | PRN
  Administered 2016-10-09: 60 mg via INTRAVENOUS

## 2016-10-09 MED ORDER — ONDANSETRON HCL 4 MG/2ML IJ SOLN
INTRAMUSCULAR | Status: DC | PRN
  Administered 2016-10-09: 4 mg via INTRAVENOUS

## 2016-10-09 MED ORDER — SENNOSIDES-DOCUSATE SODIUM 8.6-50 MG PO TABS: 1.0000 | ORAL_TABLET | Freq: Two times a day (BID) | ORAL | 0 refills | Status: DC

## 2016-10-09 MED ORDER — FENTANYL CITRATE (PF) 100 MCG/2ML IJ SOLN
INTRAMUSCULAR | Status: AC
  Filled 2016-10-09: qty 2

## 2016-10-09 MED ORDER — LACTATED RINGERS IV SOLN
INTRAVENOUS | Status: DC
  Filled 2016-10-09: qty 1000

## 2016-10-09 MED ORDER — SCOPOLAMINE 1 MG/3DAYS TD PT72
1.0000 | MEDICATED_PATCH | TRANSDERMAL | Status: DC
  Administered 2016-10-09: 1.5 mg via TRANSDERMAL
  Filled 2016-10-09: qty 1

## 2016-10-09 MED ORDER — METOCLOPRAMIDE HCL 5 MG/ML IJ SOLN
INTRAMUSCULAR | Status: AC
  Filled 2016-10-09: qty 2

## 2016-10-09 MED ORDER — LACTATED RINGERS IV SOLN
INTRAVENOUS | Status: DC
  Administered 2016-10-09: 11:00:00 via INTRAVENOUS
  Filled 2016-10-09: qty 1000

## 2016-10-09 MED ORDER — GENTAMICIN SULFATE 40 MG/ML IJ SOLN
5.0000 mg/kg | INTRAVENOUS | Status: AC
  Administered 2016-10-09: 340 mg via INTRAVENOUS
  Filled 2016-10-09: qty 8.5

## 2016-10-09 MED ORDER — METOCLOPRAMIDE HCL 5 MG/ML IJ SOLN
INTRAMUSCULAR | Status: DC | PRN
  Administered 2016-10-09: 10 mg via INTRAVENOUS

## 2016-10-09 MED ORDER — PROPOFOL 10 MG/ML IV BOLUS
INTRAVENOUS | Status: AC
  Filled 2016-10-09: qty 20

## 2016-10-09 MED ORDER — IOHEXOL 300 MG/ML  SOLN
INTRAMUSCULAR | Status: DC | PRN
Start: 2016-10-09 — End: 2016-10-09
  Administered 2016-10-09: 26 mL

## 2016-10-09 MED ORDER — NITROFURANTOIN MONOHYD MACRO 100 MG PO CAPS: 100.0000 mg | ORAL_CAPSULE | Freq: Two times a day (BID) | ORAL | 0 refills | Status: AC

## 2016-10-09 MED ORDER — SCOPOLAMINE 1 MG/3DAYS TD PT72
MEDICATED_PATCH | TRANSDERMAL | Status: AC
  Filled 2016-10-09: qty 1

## 2016-10-09 MED ORDER — TRAMADOL HCL 50 MG PO TABS
50.0000 mg | ORAL_TABLET | Freq: Four times a day (QID) | ORAL | Status: DC | PRN
  Filled 2016-10-09: qty 1

## 2016-10-09 MED ORDER — DEXAMETHASONE SODIUM PHOSPHATE 10 MG/ML IJ SOLN
INTRAMUSCULAR | Status: AC
  Filled 2016-10-09: qty 1

## 2016-10-09 MED ORDER — DEXAMETHASONE SODIUM PHOSPHATE 4 MG/ML IJ SOLN
INTRAMUSCULAR | Status: DC | PRN
  Administered 2016-10-09: 10 mg via INTRAVENOUS

## 2016-10-09 MED ORDER — METOCLOPRAMIDE HCL 5 MG/ML IJ SOLN
10.0000 mg | Freq: Once | INTRAMUSCULAR | Status: DC | PRN
  Filled 2016-10-09: qty 2

## 2016-10-09 MED ORDER — GENTAMICIN SULFATE 40 MG/ML IJ SOLN
5.0000 mg/kg | INTRAVENOUS | Status: DC
  Filled 2016-10-09: qty 10

## 2016-10-09 MED ORDER — SODIUM CHLORIDE 0.9 % IR SOLN
Status: DC | PRN
  Administered 2016-10-09: 4000 mL

## 2016-10-09 MED ORDER — FENTANYL CITRATE (PF) 100 MCG/2ML IJ SOLN
25.0000 ug | INTRAMUSCULAR | Status: DC | PRN
  Administered 2016-10-09 (×2): 50 ug via INTRAVENOUS
  Filled 2016-10-09: qty 1

## 2016-10-09 MED ORDER — ONDANSETRON HCL 4 MG/2ML IJ SOLN
INTRAMUSCULAR | Status: AC
  Filled 2016-10-09: qty 2

## 2016-10-09 MED ORDER — TRAMADOL HCL 50 MG PO TABS
ORAL_TABLET | ORAL | Status: AC
  Filled 2016-10-09: qty 1

## 2016-10-09 MED ORDER — STERILE WATER FOR IRRIGATION IR SOLN
Status: DC | PRN
  Administered 2016-10-09: 500 mL

## 2016-10-09 MED ORDER — MEPERIDINE HCL 25 MG/ML IJ SOLN
6.2500 mg | INTRAMUSCULAR | Status: DC | PRN
  Filled 2016-10-09: qty 1

## 2016-10-09 MED ORDER — PROPOFOL 10 MG/ML IV BOLUS
INTRAVENOUS | Status: DC | PRN
  Administered 2016-10-09: 200 mg via INTRAVENOUS

## 2016-10-09 MED ORDER — MIDAZOLAM HCL 5 MG/5ML IJ SOLN
INTRAMUSCULAR | Status: DC | PRN
  Administered 2016-10-09: 2 mg via INTRAVENOUS

## 2016-10-09 MED ORDER — KETOROLAC TROMETHAMINE 30 MG/ML IJ SOLN
INTRAMUSCULAR | Status: DC | PRN
  Administered 2016-10-09: 30 mg via INTRAVENOUS

## 2016-10-09 MED ORDER — TRAMADOL HCL 50 MG PO TABS: 50.0000 mg | ORAL_TABLET | Freq: Four times a day (QID) | ORAL | 0 refills | Status: AC | PRN

## 2016-10-09 MED ORDER — MIDAZOLAM HCL 2 MG/2ML IJ SOLN
INTRAMUSCULAR | Status: AC
  Filled 2016-10-09: qty 2

## 2016-10-09 MED ORDER — FENTANYL CITRATE (PF) 100 MCG/2ML IJ SOLN
INTRAMUSCULAR | Status: DC | PRN
  Administered 2016-10-09 (×2): 25 ug via INTRAVENOUS
  Administered 2016-10-09: 75 ug via INTRAVENOUS

## 2016-10-09 MED ORDER — KETOROLAC TROMETHAMINE 10 MG PO TABS: 10.0000 mg | ORAL_TABLET | Freq: Four times a day (QID) | ORAL | 1 refills | Status: DC | PRN

## 2016-10-09 SURGICAL SUPPLY — 48 items
BAG DRAIN URO-CYSTO SKYTR STRL (DRAIN) ×2 IMPLANT
BAG DRN UROCATH (DRAIN) ×1
BASKET LASER NITINOL 1.9FR (BASKET) ×1 IMPLANT
BASKET STNLS GEMINI 4WIRE 3FR (BASKET) IMPLANT
BASKET STONE NCOMPASS (UROLOGICAL SUPPLIES) ×1 IMPLANT
BASKET ZERO TIP NITINOL 2.4FR (BASKET) IMPLANT
BSKT STON RTRVL 120 1.9FR (BASKET) ×1
BSKT STON RTRVL GEM 120X11 3FR (BASKET)
BSKT STON RTRVL ZERO TP 2.4FR (BASKET)
CATH INTERMIT  6FR 70CM (CATHETERS) ×2 IMPLANT
CATH URET 5FR 28IN CONE TIP (BALLOONS)
CATH URET 5FR 28IN OPEN ENDED (CATHETERS) IMPLANT
CATH URET 5FR 70CM CONE TIP (BALLOONS) IMPLANT
CLOTH BEACON ORANGE TIMEOUT ST (SAFETY) ×2 IMPLANT
ELECT REM PT RETURN 9FT ADLT (ELECTROSURGICAL)
ELECTRODE REM PT RTRN 9FT ADLT (ELECTROSURGICAL) IMPLANT
FIBER LASER FLEXIVA 365 (UROLOGICAL SUPPLIES) IMPLANT
FIBER LASER TRAC TIP (UROLOGICAL SUPPLIES) ×1 IMPLANT
GLOVE BIO SURGEON STRL SZ7.5 (GLOVE) ×2 IMPLANT
GLOVE BIOGEL PI IND STRL 7.0 (GLOVE) IMPLANT
GLOVE BIOGEL PI IND STRL 7.5 (GLOVE) IMPLANT
GLOVE BIOGEL PI INDICATOR 7.0 (GLOVE) ×1
GLOVE BIOGEL PI INDICATOR 7.5 (GLOVE) ×1
GLOVE ECLIPSE 7.0 STRL STRAW (GLOVE) ×1 IMPLANT
GOWN STRL REUS W/ TWL LRG LVL3 (GOWN DISPOSABLE) ×1 IMPLANT
GOWN STRL REUS W/ TWL XL LVL3 (GOWN DISPOSABLE) ×1 IMPLANT
GOWN STRL REUS W/TWL LRG LVL3 (GOWN DISPOSABLE) ×4 IMPLANT
GOWN STRL REUS W/TWL XL LVL3 (GOWN DISPOSABLE)
GUIDEWIRE 0.038 PTFE COATED (WIRE) IMPLANT
GUIDEWIRE ANG ZIPWIRE 038X150 (WIRE) ×2 IMPLANT
GUIDEWIRE STR DUAL SENSOR (WIRE) ×2 IMPLANT
IV NS 1000ML (IV SOLUTION) ×2
IV NS 1000ML BAXH (IV SOLUTION) ×1 IMPLANT
IV NS IRRIG 3000ML ARTHROMATIC (IV SOLUTION) ×3 IMPLANT
KIT BALLIN UROMAX 15FX10 (LABEL) IMPLANT
KIT BALLN UROMAX 15FX4 (MISCELLANEOUS) IMPLANT
KIT BALLN UROMAX 26 75X4 (MISCELLANEOUS)
KIT RM TURNOVER CYSTO AR (KITS) ×2 IMPLANT
MANIFOLD NEPTUNE II (INSTRUMENTS) ×2 IMPLANT
NS IRRIG 500ML POUR BTL (IV SOLUTION) ×2 IMPLANT
PACK CYSTO (CUSTOM PROCEDURE TRAY) ×2 IMPLANT
SET HIGH PRES BAL DIL (LABEL)
SHEATH ACCESS URETERAL 24CM (SHEATH) ×1 IMPLANT
STENT POLARIS 5FRX24 (STENTS) ×2 IMPLANT
SYRINGE 10CC LL (SYRINGE) ×2 IMPLANT
SYRINGE IRR TOOMEY STRL 70CC (SYRINGE) IMPLANT
TUBE CONNECTING 12X1/4 (SUCTIONS) IMPLANT
TUBE FEEDING 8FR 16IN STR KANG (MISCELLANEOUS) ×2 IMPLANT

## 2016-10-09 NOTE — Op Note (Signed)
NAME:  Dawn Meadows, Dawn Meadows                  ACCOUNT NO.:  MEDICAL RECORD NO.:  [id number]  LOCATION:                                 FACILITY:  PHYSICIAN:  Alexis Frock, MD     DATE OF BIRTH:  08-12-1985  DATE OF PROCEDURE: 10/09/2016                               OPERATIVE REPORT   PREOPERATIVE DIAGNOSIS:  Bilateral renal stones.  PROCEDURES PERFORMED: 1. Cystoscopy bilateral retrograde pyelograms interpretation. 2. Bilateral ureteroscopy with laser lithotripsy. 3. Insertion of bilateral ureteral stents, 5 x 24 Polaris, no tether.  ESTIMATED BLOOD LOSS:  Nil.  COMPLICATION:  None.  SPECIMEN:  Bilateral renal stone fragments for compositional analysis.  FINDINGS: 1. Unremarkable urinary bladder. 2. Large volume left intrarenal stone likely ball-valving of renal     pelvis stone. 3. Complete resolution of all stone fragments larger than 1/3rd mm     following laser lithotripsy and basket extraction on the left side. 4. Multifocal right renal stones nonobstructing. 5. Complete resolution of all right-sided stone fragments larger than     1/3rd mm following laser lithotripsy and basket extraction. 6. Successful placement of bilateral ureteral stents, proximal in     renal pelvis and distal in the urinary bladder.  INDICATION:  Ms. Buchholz is a 31 year old lady with history of recurrent nephrolithiasis.  She was found on workup with intermittent left flank pain to have what appears to be an intermittently obstructed left UPJ stone as well as several intrarenal stones bilaterally.  Her total stone volume on the left side appears to be approximately 18 mm and on the right side approximately 1 cm.  Options were discussed for management including shockwave lithotripsy, symptomatic stone versus a ureteroscopy unilateral versus bilateral versus percutaneous approach surgery and she wishes to proceed with bilateral ureteroscopic stimulation for goal of stone free.  Informed consent  was obtained and placed in the medical record.  PROCEDURE IN DETAIL:  The patient being, Dawn Meadows, was verified. Procedure being bilateral ureteroscopic stone manipulation was confirmed.  Procedure was carried out.  Time-out was performed. Intravenous antibiotics were administered.  General anesthesia introduced.  The patient was placed into a low lithotomy position. Sterile field was created by prepping and draping the patient's vagina, introitus, and proximal thighs using iodine.  Next, cystourethroscopy was performed using a 22-French rigid cystoscope with offset lens. Inspection of the urethra and urinary bladder was unremarkable. Ureteral orifices appeared singleton bilaterally.  The left ureteral orifice was cannulated with a 6-French end-hole catheter and left retrograde pyelogram was obtained.  Left retrograde pyelogram demonstrated there is a single left ureter with single-system left kidney.  There was a filling defect in the area of the renal pelvis and UPJ consistent likely with known stone.  A 0.038 ZIPwire was advanced to the level of the lower pole set aside as safety wire.  Attention was then directed at right retrograde pyelogram.  The right ureteral orifice was cannulated with a 6-French end-hole catheter and a right retrograde pyelogram was obtained.  Right retrograde pyelogram demonstrated a single right ureter with single-system right kidney.  No obvious filling defects or narrowing noted.  A separate 0.038 ZIPwire was advanced to  the level of the upper pole, set aside as separate safety wire on the right side.  An 8-French feeding tube placed in urinary bladder for pressure release.  Next, semi- rigid ureteroscopy was performed of the entire length of right ureter alongside a separate Sensor working wire.  No mucosal abnormalities were seen.  Sensor wire was set aside as a working wire on the right. Similarly, semi-rigid ureteroscopy was performed of the  entire length of left ureter alongside a separate Sensor working wire.  No mucosal abnormalities were found.  This wire was set aside as a separate left- sided working wire.  Next, a 12/14, 24-cm ureteral access sheath was placed over the left working wire to the level of proximal ureter and then flexible digital ureteroscopy was performed on the left side allowing inspection of the proximal ureter and systematic inspection of left kidney including all calices x3.  There was a significant volume UPJ stone as anticipated, estimated 14 mm.  This appeared to be much too large for simple basketing.  There were also 2 separate intrarenal stones that were small within this, but also appeared to be too large for simple basketing.  As such, holmium laser energy applied to the stone using settings of 0.2 joules and 30 Hz with a dusting technique and approximately 70% of total stone volume was ablated in this fashion. Additional fragments were inherently generated approximately 1 mm to 2 mm in diameter.  These were then grasped sequentially with an Escape basket, removed and set aside for compositional analysis.  Next, a NCompass basket was used to remove the vast majority of the smaller stone fragments down to 1/3rd mm or less on the left side.  Following this, there was excellent hemostasis, complete resolution of all stone fragments larger than 1/3rd mm.  The access sheath was removed under continuous vision.  No mucosal abnormalities were seen on the left side. Next, the access sheath was then placed over the right working wire to the level of the proximal ureter and systematic inspection performed of the right proximal ureter and kidney with a flexible digital ureteroscope.  There were multifocal right intrarenal stones approximately 3 separate foci.  The lower pole which did appear amenable to basketing, it was grasped in Escape basket, removed and set aside.  A mid pole and upper pole  appeared to be too large for basketing, as such, holmium laser energy was again applied to the stone using 0.2 joules and 30 Hz.  With dusting technique and approximately 60% to 70% of stone volume ablated.  The remainder of stone volume was amenable to basketing and removed.  Following this, there was complete resolution of all stone fragments larger than 1/3rd mm on the right side.  The sheath was removed under continuous vision.  No mucosal abnormalities were found. Given the large volume of stones treated today and bilateral access sheaths used, it was felt that interval stenting would be warranted without tether.  As such, a new 5 x 24 Polaris-type stent was placed on the right side using cystoscopic and fluoroscopic guidance.  Good proximal and distal deployment were noted.  Similarly, a separate 5 x 24 Polaris stent was placed on the left side using cystoscopic and fluoroscopic guidance.  Good proximal and distal deployment were noted. Bladder was emptied per cystoscope.  Procedure was then terminated.  The patient tolerated the procedure well.  There were no immediate periprocedural complications.  The patient was taken to the postanesthesia care unit in stable  condition.         ______________________________ Alexis Frock, MD    TM/MEDQ  D:  10/09/2016  T:  10/09/2016  Job:  [id number]

## 2016-10-09 NOTE — Transfer of Care (Signed)
Immediate Anesthesia Transfer of Care Note  Patient: Dawn Meadows  Procedure(s) Performed: Procedure(s): CYSTOSCOPY WITH RETROGRADE PYELOGRAM, URETEROSCOPY, STONE BASKETRY  AND STENT PLACEMENT (Bilateral) HOLMIUM LASER APPLICATION (Bilateral)  Patient Location: PACU  Anesthesia Type:General  Level of Consciousness: awake, alert , oriented and patient cooperative  Airway & Oxygen Therapy: Patient Spontanous Breathing and Patient connected to nasal cannula oxygen  Post-op Assessment: Report given to RN and Post -op Vital signs reviewed and stable  Post vital signs: Reviewed and stable  Last Vitals:  Vitals:   10/09/16 1315 10/09/16 1316  BP: 129/82 129/82  Pulse:  (!) 102  Resp: 19 11  Temp:  36.4 C    Last Pain: There were no vitals filed for this visit.    Patients Stated Pain Goal: 8 (31/51/76 1607)  Complications: No apparent anesthesia complications

## 2016-10-09 NOTE — Interval H&P Note (Signed)
History and Physical Interval Note:  10/09/2016 10:59 AM  Dawn Meadows  has presented today for surgery, with the diagnosis of BILATERAL RENAL STONES  The various methods of treatment have been discussed with the patient and family. After consideration of risks, benefits and other options for treatment, the patient has consented to  Procedure(s): CYSTOSCOPY WITH RETROGRADE PYELOGRAM, URETEROSCOPY AND STENT PLACEMENT (Bilateral) HOLMIUM LASER APPLICATION (Bilateral) as a surgical intervention .  The patient's history has been reviewed, patient examined, no change in status, stable for surgery.  I have reviewed the patient's chart and labs.  Questions were answered to the patient's satisfaction.     Afomia Blackley

## 2016-10-09 NOTE — Anesthesia Preprocedure Evaluation (Signed)
Anesthesia Evaluation  Patient identified by MRN, date of birth, ID band Patient awake    Reviewed: Allergy & Precautions, NPO status , Patient's Chart, lab work & pertinent test results  History of Anesthesia Complications (+) PONV  Airway Mallampati: II  TM Distance: >3 FB Neck ROM: Full    Dental no notable dental hx.    Pulmonary neg pulmonary ROS,    Pulmonary exam normal breath sounds clear to auscultation       Cardiovascular hypertension, Pt. on medications Normal cardiovascular exam Rhythm:Regular Rate:Normal     Neuro/Psych negative neurological ROS  negative psych ROS   GI/Hepatic negative GI ROS, Neg liver ROS,   Endo/Other  negative endocrine ROS  Renal/GU Renal disease  negative genitourinary   Musculoskeletal negative musculoskeletal ROS (+)   Abdominal   Peds negative pediatric ROS (+)  Hematology negative hematology ROS (+)   Anesthesia Other Findings   Reproductive/Obstetrics negative OB ROS                             Anesthesia Physical Anesthesia Plan  ASA: II  Anesthesia Plan: General   Post-op Pain Management:    Induction: Intravenous  Airway Management Planned: LMA  Additional Equipment:   Intra-op Plan:   Post-operative Plan: Extubation in OR  Informed Consent: I have reviewed the patients History and Physical, chart, labs and discussed the procedure including the risks, benefits and alternatives for the proposed anesthesia with the patient or authorized representative who has indicated his/her understanding and acceptance.   Dental advisory given  Plan Discussed with: CRNA  Anesthesia Plan Comments:         Anesthesia Quick Evaluation

## 2016-10-09 NOTE — Brief Op Note (Signed)
10/09/2016  1:03 PM  PATIENT:  Dawn Meadows  31 y.o. female  PRE-OPERATIVE DIAGNOSIS:  BILATERAL RENAL STONES  POST-OPERATIVE DIAGNOSIS:  BILATERAL RENAL STONES  PROCEDURE:  Procedure(s): CYSTOSCOPY WITH RETROGRADE PYELOGRAM, URETEROSCOPY, STONE BASKETRY  AND STENT PLACEMENT (Bilateral) HOLMIUM LASER APPLICATION (Bilateral)  SURGEON:  Surgeon(s) and Role:    Alexis Frock, MD - Primary  PHYSICIAN ASSISTANT:   ASSISTANTS: none   ANESTHESIA:   general  EBL:  Total I/O In: 200 [I.V.:200] Out: -   BLOOD ADMINISTERED:none  DRAINS: none   LOCAL MEDICATIONS USED:  NONE  SPECIMEN:  Source of Specimen:  bilateral renal stone fragments  DISPOSITION OF SPECIMEN:  [facility] for compositional analysis  COUNTS:  YES  TOURNIQUET:  * No tourniquets in log *  DICTATION: .Other Dictation: Dictation Number [id number]  PLAN OF CARE: Discharge to home after PACU  PATIENT DISPOSITION:  PACU - hemodynamically stable.   Delay start of Pharmacological VTE agent (>24hrs) due to surgical blood loss or risk of bleeding: yes

## 2016-10-09 NOTE — Discharge Instructions (Signed)
1 - You may have urinary urgency (bladder spasms) and bloody urine on / off with stent in place. This is normal. ° °2 - Call MD or go to ER for fever >102, severe pain / nausea / vomiting not relieved by medications, or acute change in medical status ° °[facility] °[phone] °Post Ureteroscopy With or Without Stent Instructions ° °Definitions: ° °Ureter: The duct that transports urine from the kidney to the bladder. °Stent:   A plastic hollow tube that is placed into the ureter, from the kidney to the  bladder to prevent the ureter from swelling shut. ° °GENERAL INSTRUCTIONS: ° °Despite the fact that no skin incisions were used, the area around the ureter and bladder is raw and irritated. The stent is a foreign body which will further irritate the bladder wall. This irritation is manifested by increased frequency of urination, both day and night, and by an increase in the urge to urinate. In some, the urge to urinate is present almost always. Sometimes the urge is strong enough that you may not be able to stop yourself from urinating. The only real cure is to remove the stent and then give time for the bladder wall to heal which can't be done until the danger of the ureter swelling shut has passed, which varies. ° °You may see some blood in your urine while the stent is in place and a few days afterwards. Do not be alarmed, even if the urine was clear for a while. Get off your feet and drink lots of fluids until clearing occurs. If you start to pass clots or don't improve, call us. ° °DIET: °You may return to your normal diet immediately. Because of the raw surface of your bladder, alcohol, spicy foods, acid type foods and drinks with caffeine may cause irritation or frequency and should be used in moderation. To keep your urine flowing freely and to avoid constipation, drink plenty of fluids during the day ( 8-10 glasses ). °Tip: Avoid cranberry juice because it is very  acidic. ° °ACTIVITY: °Your physical activity doesn't need to be restricted. However, if you are very active, you may see some blood in your urine. We suggest that you reduce your activity under these circumstances until the bleeding has stopped. ° °BOWELS: °It is important to keep your bowels regular during the postoperative period. Straining with bowel movements can cause bleeding. A bowel movement every other day is reasonable. Use a mild laxative if needed, such as Milk of Magnesia 2-3 tablespoons, or 2 Dulcolax tablets. Call if you continue to have problems. If you have been taking narcotics for pain, before, during or after your surgery, you may be constipated. Take a laxative if necessary. ° ° °MEDICATION: °You should resume your pre-surgery medications unless told not to. In addition you will often be given an antibiotic to prevent infection. These should be taken as prescribed until the bottles are finished unless you are having an unusual reaction to one of the drugs. ° °PROBLEMS YOU SHOULD REPORT TO US: °· Fevers over 100.5 Fahrenheit. °· Heavy bleeding, or clots ( See above notes about blood in urine ). °· Inability to urinate. °· Drug reactions ( hives, rash, nausea, vomiting, diarrhea ). °· Severe burning or pain with urination that is not improving. ° °FOLLOW-UP: °You will need a follow-up appointment to monitor your progress. Call for this appointment at the number listed above. Usually the first appointment will be about three to fourteen days after your surgery. ° ° ° ° ° °  Post Anesthesia Home Care Instructions ° °Activity: °Get plenty of rest for the remainder of the day. A responsible individual must stay with you for 24 hours following the procedure.  °For the next 24 hours, DO NOT: °-Drive a car °-Operate machinery °-Drink alcoholic beverages °-Take any medication unless instructed by your physician °-Make any legal decisions or sign important papers. ° °Meals: °Start with liquid foods such as  gelatin or soup. Progress to regular foods as tolerated. Avoid greasy, spicy, heavy foods. If nausea and/or vomiting occur, drink only clear liquids until the nausea and/or vomiting subsides. Call your physician if vomiting continues. ° °Special Instructions/Symptoms: °Your throat may feel dry or sore from the anesthesia or the breathing tube placed in your throat during surgery. If this causes discomfort, gargle with warm salt water. The discomfort should disappear within 24 hours. ° °If you had a scopolamine patch placed behind your ear for the management of post- operative nausea and/or vomiting: ° °1. The medication in the patch is effective for 72 hours, after which it should be removed.  Wrap patch in a tissue and discard in the trash. Wash hands thoroughly with soap and water. °2. You may remove the patch earlier than 72 hours if you experience unpleasant side effects which may include dry mouth, dizziness or visual disturbances. °3. Avoid touching the patch. Wash your hands with soap and water after contact with the patch. °  ° ° °

## 2016-10-09 NOTE — Anesthesia Procedure Notes (Signed)
Procedure Name: LMA Insertion Date/Time: 10/09/2016 11:36 AM Performed by: Denna Haggard D Pre-anesthesia Checklist: Patient identified, Emergency Drugs available, Suction available and Patient being monitored Patient Re-evaluated:Patient Re-evaluated prior to inductionOxygen Delivery Method: Circle system utilized Preoxygenation: Pre-oxygenation with 100% oxygen Intubation Type: IV induction Ventilation: Mask ventilation without difficulty LMA: LMA inserted LMA Size: 4.0 Number of attempts: 1 Airway Equipment and Method: Bite block Placement Confirmation: positive ETCO2 Tube secured with: Tape Dental Injury: Teeth and Oropharynx as per pre-operative assessment

## 2016-10-09 NOTE — Anesthesia Postprocedure Evaluation (Signed)
Anesthesia Post Note  Patient: Dawn Meadows  Procedure(s) Performed: Procedure(s) (LRB): CYSTOSCOPY WITH RETROGRADE PYELOGRAM, URETEROSCOPY, STONE BASKETRY  AND STENT PLACEMENT (Bilateral) HOLMIUM LASER APPLICATION (Bilateral)     Patient location during evaluation: PACU Anesthesia Type: General Level of consciousness: awake and alert Pain management: pain level controlled Vital Signs Assessment: post-procedure vital signs reviewed and stable Respiratory status: spontaneous breathing, nonlabored ventilation, respiratory function stable and patient connected to nasal cannula oxygen Cardiovascular status: blood pressure returned to baseline and stable Postop Assessment: no signs of nausea or vomiting Anesthetic complications: no    Last Vitals:  Vitals:   10/09/16 1330 10/09/16 1345  BP: (!) 142/85 (!) 155/92  Pulse: 91 93  Resp: 15 15  Temp:      Last Pain:  Vitals:   10/09/16 1406  PainSc: 8                  Montez Hageman

## 2016-10-09 NOTE — H&P (Signed)
Dawn Meadows is an 31 y.o. female.    Chief Complaint: Pre-op BILATERAL Ureteroscopic Stone Manipulation  HPI:   1 - Recurrent Nephrolithiais -  Pre 2018 -  09/2016 - Lt renal pelvis 29mm (1100HU) + 62mm lower pole no hydro, Rt lower pole 50mm sotnes by [place] and confirmed by stone CT. NO hydro / obstruction. ?intermintant ball valving form left renal pelvis stone.   2 - Bacteriuria - pan-sensitive e. coli by PCP Dawn Meadows 08/2016.   PMH sig for perinatal HTN. Her PCP is Dawn Rough PA   Today "Dawn Meadows" is seen to proceed with BILATERAL ureteroscopic stone manipulation. No interval fevers. She has been on keflex pre-op as instructed given h/o bacteruria. Most recent UCX negative.   Past Medical History:  Diagnosis Date  . Anxiety   . Dermatitis   . Essential hypertension    MONITORED BY PCP  . GERD (gastroesophageal reflux disease)    prilosec prn  . Headache(784.0)   . Hematuria   . History of gestational hypertension    PT HAS HYPERTENSION BUT EXACERBATION DURING PREGNANCY'S  . History of kidney stones    2012 at 38 wks per pt thinks passed spontaneous  . History of palpitations   . History of panic attacks   . PONV (postoperative nausea and vomiting)    per pt"has extreme anxiety about throwing up"  . Postpartum hypertension MONITORED BY OB/GYN- DR Dawn Meadows   RECENT SVD 07-21-2016 -- DX POSTPARTUM PRE-CLAMPSIA PPD 4  . Renal calculus, bilateral   . SVD (spontaneous vaginal delivery)    07-21-2016    Past Surgical History:  Procedure Laterality Date  . TRANSTHORACIC ECHOCARDIOGRAM  07/11/2013   EF 55-60%/ trivial MR and PR  . WISDOM TOOTH EXTRACTION  2001    Family History  Problem Relation Age of Onset  . Breast cancer Maternal Grandmother   . Lung cancer Maternal Grandmother   . Hypotension Neg Hx   . Malignant hyperthermia Neg Hx   . Pseudochol deficiency Neg Hx   . Anesthesia problems Neg Hx    Social History:  reports that she has never smoked. She has never  used smokeless tobacco. She reports that she does not drink alcohol or use drugs.  Allergies:  Allergies  Allergen Reactions  . Codeine Nausea And Vomiting  . Penicillins Rash    Has patient had a PCN reaction causing immediate rash, facial/tongue/throat swelling, SOB or lightheadedness with hypotension: No Has patient had a PCN reaction causing severe rash involving mucus membranes or skin necrosis: No Has patient had a PCN reaction that required hospitalization no Has patient had a PCN reaction occurring within the last 10 years: No If all of the above answers are "NO", then may proceed with Cephalosporin use.   . Procardia [Nifedipine] Rash    No prescriptions prior to admission.    No results found for this or any previous visit (from the past 48 hour(s)). No results found.  Review of Systems  Constitutional: Negative.  Negative for chills and fever.  HENT: Negative.   Eyes: Negative.   Respiratory: Negative.   Cardiovascular: Negative.   Gastrointestinal: Negative.   Genitourinary: Positive for flank pain.  Skin: Negative.   Neurological: Negative.   Endo/Heme/Allergies: Negative.   Psychiatric/Behavioral: Negative.     Height 5\' 6"  (1.676 m), weight 80.7 kg (178 lb), last menstrual period 09/26/2016, not currently breastfeeding. Physical Exam  Constitutional: She appears well-developed.  HENT:  Head: Normocephalic.  Eyes: Pupils are equal, round, and  reactive to light.  Neck: Normal range of motion.  Cardiovascular: Normal rate.   Respiratory: Effort normal.  GI: Soft.  Genitourinary:  Genitourinary Comments: No CVAT at present   Neurological: She is alert.  Skin: Skin is warm.  Psychiatric: She has a normal mood and affect.     Assessment/Meadows  Proceed as planned with BILATERAL ureteroscopic stone manipulation. Risks, benefits, alternatives, expected peri-op course, need for peri-op stents since bilateral discussed previously and reiterated today.     Dawn Frock, MD 10/09/2016, 6:17 AM

## 2016-10-12 ENCOUNTER — Encounter (HOSPITAL_BASED_OUTPATIENT_CLINIC_OR_DEPARTMENT_OTHER): Payer: Self-pay | Admitting: Urology

## 2016-11-04 DIAGNOSIS — A6000 Herpesviral infection of urogenital system, unspecified: Secondary | ICD-10-CM | POA: Insufficient documentation

## 2018-05-24 DIAGNOSIS — K5909 Other constipation: Secondary | ICD-10-CM | POA: Insufficient documentation

## 2018-11-25 ENCOUNTER — Other Ambulatory Visit: Payer: Self-pay

## 2018-11-25 ENCOUNTER — Encounter: Payer: Self-pay | Admitting: Plastic Surgery

## 2018-11-25 ENCOUNTER — Ambulatory Visit: Payer: 59 | Admitting: Plastic Surgery

## 2018-11-25 DIAGNOSIS — L989 Disorder of the skin and subcutaneous tissue, unspecified: Secondary | ICD-10-CM

## 2018-11-25 DIAGNOSIS — R2242 Localized swelling, mass and lump, left lower limb: Secondary | ICD-10-CM | POA: Diagnosis not present

## 2018-11-25 NOTE — Progress Notes (Signed)
Patient ID: Dawn Meadows, female    DOB: [birth year], 33 y.o.   MRN: [id number]   Chief Complaint  Patient presents with  . Advice Only    for cyst on (L) thigh, moles on chin and back    The patient is a 33 year old female here for evaluation of several changing and concerning skin lesions.  She has noticed a 1.5 cm on the anterior left thigh.  It is somewhat in and under the skin.  It is raised and firm.  It feels like a dermatofibroma.  She states that it is been there for over a year and seems to be getting larger.  Sometimes it itches and is a little painful.  She also has a changing skin lesion on her right chin.  It is flesh-colored.  It is raised.  It is getting larger.  It is now 1 cm in size.  She has a 3 mm changing skin lesion on her forehead.  This is also flesh-colored.  A little bit tender and starting to get larger.  She has 2 skin tags on her back area 1 the posterior neck and one on the back.  She is otherwise in good health.  She has not had surgery and does not have a history of bad scarring.   Review of Systems  Constitutional: Negative for activity change and appetite change.  HENT: Negative for dental problem.   Eyes: Negative.   Cardiovascular: Negative for leg swelling.  Gastrointestinal: Negative for abdominal pain.  Endocrine: Negative.   Genitourinary: Negative.   Musculoskeletal: Negative for back pain and joint swelling.  Skin: Negative for color change and wound.  Psychiatric/Behavioral: Negative for agitation.    Past Medical History:  Diagnosis Date  . Anxiety   . Dermatitis   . Essential hypertension    MONITORED BY PCP  . GERD (gastroesophageal reflux disease)    prilosec prn  . Headache(784.0)   . Hematuria   . History of gestational hypertension    PT HAS HYPERTENSION BUT EXACERBATION DURING PREGNANCY'S  . History of kidney stones    2012 at 38 wks per pt thinks passed spontaneous  . History of palpitations   . History of panic attacks    . PONV (postoperative nausea and vomiting)    per pt"has extreme anxiety about throwing up"  . Postpartum hypertension MONITORED BY OB/GYN- DR Marvel Plan   RECENT SVD 07-21-2016 -- DX POSTPARTUM PRE-CLAMPSIA PPD 4  . SVD (spontaneous vaginal delivery)    07-21-2016    Past Surgical History:  Procedure Laterality Date  . CYSTOSCOPY WITH RETROGRADE PYELOGRAM, URETEROSCOPY AND STENT PLACEMENT Bilateral 10/09/2016   Procedure: CYSTOSCOPY WITH RETROGRADE PYELOGRAM, URETEROSCOPY, STONE BASKETRY  AND STENT PLACEMENT;  Surgeon: Alexis Frock, MD;  Location: [facility];  Service: Urology;  Laterality: Bilateral;  . HOLMIUM LASER APPLICATION Bilateral 09/17/9355   Procedure: HOLMIUM LASER APPLICATION;  Surgeon: Alexis Frock, MD;  Location: [facility];  Service: Urology;  Laterality: Bilateral;  . TRANSTHORACIC ECHOCARDIOGRAM  07/11/2013   EF 55-60%/ trivial MR and PR  . WISDOM TOOTH EXTRACTION  2001      Current Outpatient Medications:  .  indapamide (LOZOL) 1.25 MG tablet, indapamide 1.25 mg tablet  TAKE 1 TABLET BY MOUTH EVERY DAY, Disp: , Rfl:  .  sertraline (ZOLOFT) 50 MG tablet, Take 1 tablet by mouth every morning. , Disp: , Rfl:    Objective:   Vitals:   11/25/18 1036  BP: 116/74  Pulse: 77  Temp: 98.4 F (36.9 C)  SpO2: 98%    Physical Exam Vitals signs and nursing note reviewed.  Constitutional:      Appearance: Normal appearance.  HENT:     Head: Normocephalic and atraumatic.  Eyes:     Extraocular Movements: Extraocular movements intact.     Pupils: Pupils are equal, round, and reactive to light.  Cardiovascular:     Rate and Rhythm: Normal rate.     Pulses: Normal pulses.  Pulmonary:     Effort: Pulmonary effort is normal. No respiratory distress.  Abdominal:     General: Abdomen is flat.  Neurological:     General: No focal deficit present.     Mental Status: She is alert and oriented to person, place, and time.  Psychiatric:         Mood and Affect: Mood normal.        Behavior: Behavior normal.        Thought Content: Thought content normal.        Judgment: Judgment normal.     Assessment & Plan:     ICD-10-CM   1. Changing skin lesion  L98.9   2. Mass of thigh, left  R22.42      Recommend excision of mass of the left thigh, changing skin lesion of right chin and changing skin lesion of forehead. We can also remove the 2 skin tags on the posterior neck and back. Pictures were obtained of the patient and placed in the chart with the patient's or guardian's permission.   [place], DO

## 2018-12-30 ENCOUNTER — Other Ambulatory Visit: Payer: Self-pay

## 2018-12-30 ENCOUNTER — Encounter (HOSPITAL_BASED_OUTPATIENT_CLINIC_OR_DEPARTMENT_OTHER): Payer: Self-pay | Admitting: *Deleted

## 2018-12-30 ENCOUNTER — Other Ambulatory Visit: Payer: Self-pay | Admitting: General Surgery

## 2018-12-30 ENCOUNTER — Other Ambulatory Visit (HOSPITAL_COMMUNITY)
Admission: RE | Admit: 2018-12-30 | Discharge: 2018-12-30 | Disposition: A | Discharge status: Home / Self Care | Payer: 59 | Source: Ambulatory Visit | Attending: General Surgery | Admitting: General Surgery

## 2018-12-30 DIAGNOSIS — Z01812 Encounter for preprocedural laboratory examination: Secondary | ICD-10-CM | POA: Insufficient documentation

## 2018-12-30 DIAGNOSIS — Z20828 Contact with and (suspected) exposure to other viral communicable diseases: Secondary | ICD-10-CM | POA: Diagnosis not present

## 2018-12-30 LAB — SARS CORONAVIRUS 2 (TAT 6-24 HRS): SARS Coronavirus 2: NEGATIVE

## 2019-01-02 ENCOUNTER — Encounter (HOSPITAL_BASED_OUTPATIENT_CLINIC_OR_DEPARTMENT_OTHER)
Admission: RE | Admit: 2019-01-02 | Discharge: 2019-01-02 | Disposition: A | Discharge status: Home / Self Care | Payer: 59 | Source: Ambulatory Visit | Attending: General Surgery | Admitting: General Surgery

## 2019-01-02 ENCOUNTER — Other Ambulatory Visit: Payer: Self-pay

## 2019-01-02 DIAGNOSIS — Z884 Allergy status to anesthetic agent status: Secondary | ICD-10-CM | POA: Diagnosis not present

## 2019-01-02 DIAGNOSIS — K8000 Calculus of gallbladder with acute cholecystitis without obstruction: Secondary | ICD-10-CM | POA: Diagnosis present

## 2019-01-02 DIAGNOSIS — Z885 Allergy status to narcotic agent status: Secondary | ICD-10-CM | POA: Diagnosis not present

## 2019-01-02 DIAGNOSIS — F419 Anxiety disorder, unspecified: Secondary | ICD-10-CM | POA: Diagnosis not present

## 2019-01-02 DIAGNOSIS — Z01812 Encounter for preprocedural laboratory examination: Secondary | ICD-10-CM | POA: Insufficient documentation

## 2019-01-02 DIAGNOSIS — K219 Gastro-esophageal reflux disease without esophagitis: Secondary | ICD-10-CM | POA: Diagnosis not present

## 2019-01-02 DIAGNOSIS — Z880 Allergy status to penicillin: Secondary | ICD-10-CM | POA: Diagnosis not present

## 2019-01-02 DIAGNOSIS — Z882 Allergy status to sulfonamides status: Secondary | ICD-10-CM | POA: Diagnosis not present

## 2019-01-02 DIAGNOSIS — Z79899 Other long term (current) drug therapy: Secondary | ICD-10-CM | POA: Diagnosis not present

## 2019-01-02 DIAGNOSIS — I10 Essential (primary) hypertension: Secondary | ICD-10-CM | POA: Diagnosis not present

## 2019-01-02 DIAGNOSIS — K8010 Calculus of gallbladder with chronic cholecystitis without obstruction: Secondary | ICD-10-CM | POA: Diagnosis not present

## 2019-01-02 LAB — BASIC METABOLIC PANEL
Anion gap: 9 (ref 5–15)
BUN: 21 mg/dL — ABNORMAL HIGH (ref 6–20)
CO2: 26 mmol/L (ref 22–32)
Calcium: 9.5 mg/dL (ref 8.9–10.3)
Chloride: 104 mmol/L (ref 98–111)
Creatinine, Ser: 0.91 mg/dL (ref 0.44–1.00)
GFR calc Af Amer: >60 mL/min (ref >60)
GFR calc non Af Amer: >60 mL/min (ref >60)
Glucose, Bld: 97 mg/dL (ref 70–99)
Potassium: 3.8 mmol/L (ref 3.5–5.1)
Sodium: 139 mmol/L (ref 135–145)

## 2019-01-02 LAB — POCT PREGNANCY, URINE: Preg Test, Ur: NEGATIVE

## 2019-01-02 NOTE — Progress Notes (Signed)

## 2019-01-03 ENCOUNTER — Ambulatory Visit (HOSPITAL_BASED_OUTPATIENT_CLINIC_OR_DEPARTMENT_OTHER): Payer: 59 | Admitting: Certified Registered"

## 2019-01-03 ENCOUNTER — Other Ambulatory Visit: Payer: Self-pay

## 2019-01-03 ENCOUNTER — Encounter (HOSPITAL_BASED_OUTPATIENT_CLINIC_OR_DEPARTMENT_OTHER): Payer: Self-pay

## 2019-01-03 ENCOUNTER — Encounter (HOSPITAL_BASED_OUTPATIENT_CLINIC_OR_DEPARTMENT_OTHER)
Admission: RE | Disposition: A | Discharge status: Home / Self Care | Payer: Self-pay | Source: Home / Self Care | Attending: General Surgery

## 2019-01-03 ENCOUNTER — Ambulatory Visit (HOSPITAL_BASED_OUTPATIENT_CLINIC_OR_DEPARTMENT_OTHER)
Admission: RE | Admit: 2019-01-03 | Discharge: 2019-01-03 | Disposition: A | Discharge status: Home / Self Care | Payer: 59 | Attending: General Surgery | Admitting: General Surgery

## 2019-01-03 DIAGNOSIS — K8000 Calculus of gallbladder with acute cholecystitis without obstruction: Secondary | ICD-10-CM | POA: Insufficient documentation

## 2019-01-03 DIAGNOSIS — K219 Gastro-esophageal reflux disease without esophagitis: Secondary | ICD-10-CM | POA: Insufficient documentation

## 2019-01-03 DIAGNOSIS — Z884 Allergy status to anesthetic agent status: Secondary | ICD-10-CM | POA: Insufficient documentation

## 2019-01-03 DIAGNOSIS — Z885 Allergy status to narcotic agent status: Secondary | ICD-10-CM | POA: Insufficient documentation

## 2019-01-03 DIAGNOSIS — K8010 Calculus of gallbladder with chronic cholecystitis without obstruction: Secondary | ICD-10-CM | POA: Insufficient documentation

## 2019-01-03 DIAGNOSIS — Z882 Allergy status to sulfonamides status: Secondary | ICD-10-CM | POA: Insufficient documentation

## 2019-01-03 DIAGNOSIS — Z880 Allergy status to penicillin: Secondary | ICD-10-CM | POA: Insufficient documentation

## 2019-01-03 DIAGNOSIS — F419 Anxiety disorder, unspecified: Secondary | ICD-10-CM | POA: Insufficient documentation

## 2019-01-03 DIAGNOSIS — Z79899 Other long term (current) drug therapy: Secondary | ICD-10-CM | POA: Insufficient documentation

## 2019-01-03 DIAGNOSIS — I10 Essential (primary) hypertension: Secondary | ICD-10-CM | POA: Insufficient documentation

## 2019-01-03 HISTORY — PX: CHOLECYSTECTOMY: SHX55

## 2019-01-03 SURGERY — LAPAROSCOPIC CHOLECYSTECTOMY
Anesthesia: General | Site: Abdomen

## 2019-01-03 MED ORDER — MIDAZOLAM HCL 2 MG/2ML IJ SOLN
INTRAMUSCULAR | Status: AC
  Filled 2019-01-03: qty 2

## 2019-01-03 MED ORDER — SODIUM CHLORIDE 0.9 % IR SOLN
Status: DC | PRN
  Administered 2019-01-03: 1

## 2019-01-03 MED ORDER — ACETAMINOPHEN 500 MG PO TABS
1000.0000 mg | ORAL_TABLET | ORAL | Status: AC
  Administered 2019-01-03: 11:00:00 1000 mg via ORAL

## 2019-01-03 MED ORDER — KETOROLAC TROMETHAMINE 15 MG/ML IJ SOLN
15.0000 mg | INTRAMUSCULAR | Status: AC
  Administered 2019-01-03: 11:00:00 15 mg via INTRAVENOUS

## 2019-01-03 MED ORDER — CEFAZOLIN SODIUM-DEXTROSE 2-4 GM/100ML-% IV SOLN
2.0000 g | INTRAVENOUS | Status: AC
  Administered 2019-01-03: 2 g via INTRAVENOUS

## 2019-01-03 MED ORDER — FENTANYL CITRATE (PF) 100 MCG/2ML IJ SOLN
INTRAMUSCULAR | Status: AC
  Filled 2019-01-03: qty 2

## 2019-01-03 MED ORDER — SUGAMMADEX SODIUM 200 MG/2ML IV SOLN
INTRAVENOUS | Status: DC | PRN
  Administered 2019-01-03: 200 mg via INTRAVENOUS

## 2019-01-03 MED ORDER — OXYCODONE HCL 5 MG PO TABS: 5.0000 mg | ORAL_TABLET | Freq: Four times a day (QID) | ORAL | 0 refills | Status: DC | PRN

## 2019-01-03 MED ORDER — DEXAMETHASONE SODIUM PHOSPHATE 10 MG/ML IJ SOLN
INTRAMUSCULAR | Status: DC | PRN
  Administered 2019-01-03: 5 mg via INTRAVENOUS

## 2019-01-03 MED ORDER — GABAPENTIN 100 MG PO CAPS
100.0000 mg | ORAL_CAPSULE | ORAL | Status: AC
  Administered 2019-01-03: 11:00:00 100 mg via ORAL

## 2019-01-03 MED ORDER — ROCURONIUM BROMIDE 100 MG/10ML IV SOLN
INTRAVENOUS | Status: DC | PRN
  Administered 2019-01-03: 50 mg via INTRAVENOUS

## 2019-01-03 MED ORDER — FENTANYL CITRATE (PF) 100 MCG/2ML IJ SOLN
50.0000 ug | INTRAMUSCULAR | Status: DC | PRN
  Administered 2019-01-03 (×2): 100 ug via INTRAVENOUS

## 2019-01-03 MED ORDER — LIDOCAINE 2% (20 MG/ML) 5 ML SYRINGE
INTRAMUSCULAR | Status: AC
  Filled 2019-01-03: qty 5

## 2019-01-03 MED ORDER — LACTATED RINGERS IV SOLN
INTRAVENOUS | Status: DC
  Administered 2019-01-03: 12:00:00 via INTRAVENOUS

## 2019-01-03 MED ORDER — ROCURONIUM BROMIDE 10 MG/ML (PF) SYRINGE
PREFILLED_SYRINGE | INTRAVENOUS | Status: AC
  Filled 2019-01-03: qty 10

## 2019-01-03 MED ORDER — PROPOFOL 10 MG/ML IV BOLUS
INTRAVENOUS | Status: AC
  Filled 2019-01-03: qty 20

## 2019-01-03 MED ORDER — ONDANSETRON HCL 4 MG/2ML IJ SOLN
INTRAMUSCULAR | Status: AC
  Filled 2019-01-03: qty 2

## 2019-01-03 MED ORDER — GABAPENTIN 100 MG PO CAPS
ORAL_CAPSULE | ORAL | Status: AC
  Filled 2019-01-03: qty 1

## 2019-01-03 MED ORDER — PROPOFOL 500 MG/50ML IV EMUL
INTRAVENOUS | Status: DC | PRN
  Administered 2019-01-03: 40 ug/kg/min via INTRAVENOUS

## 2019-01-03 MED ORDER — LIDOCAINE HCL (CARDIAC) PF 100 MG/5ML IV SOSY
PREFILLED_SYRINGE | INTRAVENOUS | Status: DC | PRN
  Administered 2019-01-03: 60 mg via INTRAVENOUS

## 2019-01-03 MED ORDER — ACETAMINOPHEN 500 MG PO TABS
ORAL_TABLET | ORAL | Status: AC
  Filled 2019-01-03: qty 2

## 2019-01-03 MED ORDER — SCOPOLAMINE 1 MG/3DAYS TD PT72
MEDICATED_PATCH | TRANSDERMAL | Status: AC
  Filled 2019-01-03: qty 1

## 2019-01-03 MED ORDER — DEXAMETHASONE SODIUM PHOSPHATE 10 MG/ML IJ SOLN
INTRAMUSCULAR | Status: AC
  Filled 2019-01-03: qty 1

## 2019-01-03 MED ORDER — ONDANSETRON 8 MG PO TBDP: 8.0000 mg | ORAL_TABLET | Freq: Three times a day (TID) | ORAL | 0 refills | Status: DC | PRN

## 2019-01-03 MED ORDER — KETOROLAC TROMETHAMINE 15 MG/ML IJ SOLN
INTRAMUSCULAR | Status: AC
  Filled 2019-01-03: qty 1

## 2019-01-03 MED ORDER — MIDAZOLAM HCL 2 MG/2ML IJ SOLN
1.0000 mg | INTRAMUSCULAR | Status: DC | PRN
  Administered 2019-01-03: 2 mg via INTRAVENOUS

## 2019-01-03 MED ORDER — SCOPOLAMINE 1 MG/3DAYS TD PT72
1.0000 | MEDICATED_PATCH | Freq: Once | TRANSDERMAL | Status: AC | PRN
  Administered 2019-01-03: 1 via TRANSDERMAL

## 2019-01-03 MED ORDER — PROPOFOL 10 MG/ML IV BOLUS
INTRAVENOUS | Status: DC | PRN
  Administered 2019-01-03: 200 mg via INTRAVENOUS

## 2019-01-03 MED ORDER — BUPIVACAINE-EPINEPHRINE 0.25% -1:200000 IJ SOLN
INTRAMUSCULAR | Status: DC | PRN
  Administered 2019-01-03: 10 mL

## 2019-01-03 MED ORDER — CEFAZOLIN SODIUM-DEXTROSE 2-4 GM/100ML-% IV SOLN
INTRAVENOUS | Status: AC
  Filled 2019-01-03: qty 100

## 2019-01-03 SURGICAL SUPPLY — 48 items
ADH SKN CLS APL DERMABOND .7 (GAUZE/BANDAGES/DRESSINGS) ×1
APL PRP STRL LF DISP 70% ISPRP (MISCELLANEOUS) ×1
APPLIER CLIP 5 13 M/L LIGAMAX5 (MISCELLANEOUS) ×3
APR CLP MED LRG 5 ANG JAW (MISCELLANEOUS) ×1
BAG SPEC RTRVL 10 TROC 200 (ENDOMECHANICALS) ×1
BLADE CLIPPER SURG (BLADE) IMPLANT
CANISTER SUCT 1200ML W/VALVE (MISCELLANEOUS) ×3 IMPLANT
CHLORAPREP W/TINT 26 (MISCELLANEOUS) ×3 IMPLANT
CLIP APPLIE 5 13 M/L LIGAMAX5 (MISCELLANEOUS) ×1 IMPLANT
CLOSURE WOUND 1/2 X4 (GAUZE/BANDAGES/DRESSINGS) ×1
COVER MAYO STAND REUSABLE (DRAPES) ×2 IMPLANT
COVER WAND RF STERILE (DRAPES) IMPLANT
DECANTER SPIKE VIAL GLASS SM (MISCELLANEOUS) ×2 IMPLANT
DERMABOND ADVANCED (GAUZE/BANDAGES/DRESSINGS) ×2
DERMABOND ADVANCED .7 DNX12 (GAUZE/BANDAGES/DRESSINGS) ×1 IMPLANT
DEVICE TROCAR PUNCTURE CLOSURE (ENDOMECHANICALS) IMPLANT
DRAPE C-ARM 42X72 X-RAY (DRAPES) IMPLANT
DRAPE LAPAROSCOPIC ABDOMINAL (DRAPES) ×3 IMPLANT
ELECT REM PT RETURN 9FT ADLT (ELECTROSURGICAL) ×3
ELECTRODE REM PT RTRN 9FT ADLT (ELECTROSURGICAL) ×1 IMPLANT
FILTER SMOKE EVAC LAPAROSHD (FILTER) IMPLANT
GLOVE BIO SURGEON STRL SZ7 (GLOVE) ×3 IMPLANT
GLOVE BIOGEL PI IND STRL 7.5 (GLOVE) ×1 IMPLANT
GLOVE BIOGEL PI INDICATOR 7.5 (GLOVE) ×4
GOWN STRL REUS W/ TWL LRG LVL3 (GOWN DISPOSABLE) ×3 IMPLANT
GOWN STRL REUS W/TWL LRG LVL3 (GOWN DISPOSABLE) ×9
GRASPER SUT TROCAR 14GX15 (MISCELLANEOUS) IMPLANT
HEMOSTAT SNOW SURGICEL 2X4 (HEMOSTASIS) IMPLANT
NS IRRIG 1000ML POUR BTL (IV SOLUTION) ×3 IMPLANT
PACK BASIN DAY SURGERY FS (CUSTOM PROCEDURE TRAY) ×3 IMPLANT
POUCH RETRIEVAL ECOSAC 10 (ENDOMECHANICALS) ×1 IMPLANT
POUCH RETRIEVAL ECOSAC 10MM (ENDOMECHANICALS) ×2
SCISSORS LAP 5X35 DISP (ENDOMECHANICALS) ×3 IMPLANT
SET CHOLANGIOGRAPH 5 50 .035 (SET/KITS/TRAYS/PACK) IMPLANT
SET IRRIG TUBING LAPAROSCOPIC (IRRIGATION / IRRIGATOR) ×3 IMPLANT
SET TUBE SMOKE EVAC HIGH FLOW (TUBING) ×3 IMPLANT
SLEEVE ENDOPATH XCEL 5M (ENDOMECHANICALS) ×6 IMPLANT
SLEEVE SCD COMPRESS KNEE MED (MISCELLANEOUS) ×3 IMPLANT
SPECIMEN JAR SMALL (MISCELLANEOUS) ×3 IMPLANT
STRIP CLOSURE SKIN 1/2X4 (GAUZE/BANDAGES/DRESSINGS) ×2 IMPLANT
SUT MNCRL AB 4-0 PS2 18 (SUTURE) ×3 IMPLANT
SUT VICRYL 0 UR6 27IN ABS (SUTURE) ×4 IMPLANT
TOWEL GREEN STERILE FF (TOWEL DISPOSABLE) ×6 IMPLANT
TRAY LAPAROSCOPIC (CUSTOM PROCEDURE TRAY) ×3 IMPLANT
TROCAR XCEL BLUNT TIP 100MML (ENDOMECHANICALS) ×3 IMPLANT
TROCAR XCEL NON-BLD 5MMX100MML (ENDOMECHANICALS) ×3 IMPLANT
TUBE CONNECTING 20'X1/4 (TUBING) ×1
TUBE CONNECTING 20X1/4 (TUBING) ×2 IMPLANT

## 2019-01-03 NOTE — Anesthesia Postprocedure Evaluation (Signed)
Anesthesia Post Note  Patient: Dawn Meadows  Procedure(s) Performed: LAPAROSCOPIC CHOLECYSTECTOMY (N/A Abdomen)     Patient location during evaluation: PACU Anesthesia Type: General Level of consciousness: awake and alert Pain management: pain level controlled Vital Signs Assessment: post-procedure vital signs reviewed and stable Respiratory status: spontaneous breathing, nonlabored ventilation, respiratory function stable and patient connected to nasal cannula oxygen Cardiovascular status: blood pressure returned to baseline and stable Postop Assessment: no apparent nausea or vomiting Anesthetic complications: no    Last Vitals:  Vitals:   01/03/19 1430 01/03/19 1500  BP:  (!) 139/95  Pulse: (!) 110 (!) 111  Resp: 19 18  Temp:  37.3 C  SpO2: 100%     Last Pain:  Vitals:   01/03/19 1500  TempSrc:   PainSc: 4                  Montez Hageman

## 2019-01-03 NOTE — H&P (Signed)
33 yof referred by Scarlette Calico for ruq pain. she has history of "indigestion" and some ruq pain. she noted earlier this week after eating ruq/epigastric pain that persisted. it is better but still there. no n/v. no fevers. hurts to move around and to touch. not eating anything fatty now. tol po. she had [place] that shows gallstone, nl cbd, wall thickened with sono murphys sign. her lfts are normal, lipase normal. wbc 10.3 she is here today to discuss options   Past Surgical History (Tanisha A. Owens Shark, [place]; 12/30/2018 9:37 AM) Oral Surgery   Diagnostic Studies History (Tanisha A. Owens Shark, [place]; 12/30/2018 9:37 AM) Colonoscopy  never Mammogram  never Pap Smear  1-5 years ago  Allergies (Tanisha A. Owens Shark, [place]; 12/30/2018 9:38 AM) Codeine Sulfate *ANALGESICS - OPIOID*  Penicillin V *PENICILLINS*  Allergies Reconciled   Medication History (Tanisha A. Owens Shark, [place]; 12/30/2018 9:39 AM) Indapamide (1.25MG  Tablet, Oral) Active. NuvaRing (0.12-0.015MG /24HR Ring, Vaginal) Active. Protonix (40MG  Packet, Oral) Active. Sertraline HCl (50MG  Tablet, Oral) Active. Medications Reconciled  Social History (Tanisha A. Owens Shark, [place]; 12/30/2018 9:37 AM) Caffeine use  Carbonated beverages. No alcohol use  No drug use  Tobacco use  Never smoker.  Family History (Tanisha A. Owens Shark, [place]; 12/30/2018 9:37 AM) First Degree Relatives  No pertinent family history   Pregnancy / Birth History (Tanisha A. Owens Shark, [place]; 12/30/2018 9:37 AM) Age at menarche  33 years, 12 years. Contraceptive History  Intrauterine device, Oral contraceptives. Gravida  2 1 Length (months) of breastfeeding  3-6 Maternal age  34-25 26-30 Para  2 1 Regular periods   Other Problems (Tanisha A. Owens Shark, [place]; 12/30/2018 9:37 AM) Anxiety Disorder  Cholelithiasis  Gastroesophageal Reflux Disease  High blood pressure  Kidney Stone     Review of Systems (Tanisha A. Brown RMA; 12/30/2018 9:37 AM) General Not Present-  Appetite Loss, Chills, Fatigue, Fever, Night Sweats, Weight Gain and Weight Loss. Skin Not Present- Change in Wart/Mole, Dryness, Hives, Jaundice, New Lesions, Non-Healing Wounds, Rash and Ulcer. HEENT Not Present- Earache, Hearing Loss, Hoarseness, Nose Bleed, Oral Ulcers, Ringing in the Ears, Seasonal Allergies, Sinus Pain, Sore Throat, Visual Disturbances, Wears glasses/contact lenses and Yellow Eyes. Respiratory Not Present- Bloody sputum, Chronic Cough, Difficulty Breathing, Snoring and Wheezing. Breast Not Present- Breast Mass, Breast Pain, Nipple Discharge and Skin Changes. Cardiovascular Not Present- Chest Pain, Difficulty Breathing Lying Down, Leg Cramps, Palpitations, Rapid Heart Rate, Shortness of Breath and Swelling of Extremities. Gastrointestinal Present- Abdominal Pain, Excessive gas and Indigestion. Not Present- Bloating, Bloody Stool, Change in Bowel Habits, Chronic diarrhea, Constipation, Difficulty Swallowing, Gets full quickly at meals, Hemorrhoids, Nausea, Rectal Pain and Vomiting. Female Genitourinary Present- Nocturia. Not Present- Frequency, Painful Urination, Pelvic Pain and Urgency. Musculoskeletal Not Present- Back Pain, Joint Pain, Joint Stiffness, Muscle Pain, Muscle Weakness and Swelling of Extremities. Neurological Not Present- Decreased Memory, Fainting, Headaches, Numbness, Seizures, Tingling, Tremor, Trouble walking and Weakness. Psychiatric Not Present- Anxiety, Bipolar, Change in Sleep Pattern, Depression, Fearful and Frequent crying. Endocrine Not Present- Cold Intolerance, Excessive Hunger, Hair Changes, Heat Intolerance, Hot flashes and New Diabetes. Hematology Not Present- Blood Thinners, Easy Bruising, Excessive bleeding, Gland problems, HIV and Persistent Infections.  Vitals (Tanisha A. Brown RMA; 12/30/2018 9:38 AM) 12/30/2018 9:37 AM Weight: 155.4 lb Height: 66in Body Surface Area: 1.8 m Body Mass Index: 25.08 kg/m  Temp.: 97.61F  Pulse: 93  (Regular)  BP: 132/82(Sitting, Left Arm, Standard)       Physical Exam Rolm Bookbinder MD; 12/30/2018 10:24 AM) General Mental Status-Alert. Orientation-Oriented X3.  Head and Neck Trachea-midline.  Eye Sclera/Conjunctiva - Bilateral-No scleral icterus.  Chest and Lung Exam Chest and lung exam reveals -quiet, even and easy respiratory effort with no use of accessory muscles and on auscultation, normal breath sounds, no adventitious sounds and normal vocal resonance.  Cardiovascular Cardiovascular examination reveals -normal heart sounds, regular rate and rhythm with no murmurs.  Abdomen Note: tender ruq, soft nondistended   Neurologic Neurologic evaluation reveals -alert and oriented x 3 with no impairment of recent or remote memory.    Assessment & Plan Stark Klein MD; 12/30/2018 10:33 AM)  GALLSTONES (K80.20) Story: Laparoscopic cholecystectomy I think she has episode acute cholecystitis. it is better. will give abx and plan for surgery asap I discussed the procedure in detail. We discussed the risks and benefits of a laparoscopic cholecystectomy and possible cholangiogram including, but not limited to bleeding, infection, injury to surrounding structures such as the intestine or liver, bile leak, retained gallstones, need to convert to an open procedure, prolonged diarrhea, blood clots such as DVT, common bile duct injury, anesthesia risks, and possible need for additional procedures. The likelihood of improvement in symptoms and return to the patient's normal status is good. We discussed the typical post-operative recovery course.

## 2019-01-03 NOTE — Transfer of Care (Signed)
Immediate Anesthesia Transfer of Care Note  Patient: Dawn Meadows  Procedure(s) Performed: LAPAROSCOPIC CHOLECYSTECTOMY (N/A )  Patient Location: PACU  Anesthesia Type:General  Level of Consciousness: awake, alert , oriented and patient cooperative  Airway & Oxygen Therapy: Patient Spontanous Breathing and Patient connected to face mask oxygen  Post-op Assessment: Report given to RN and Post -op Vital signs reviewed and stable  Post vital signs: Reviewed and stable  Last Vitals:  Vitals Value Taken Time  BP    Temp    Pulse 140 01/03/19 1344  Resp 0 01/03/19 1344  SpO2 100 % 01/03/19 1344  Vitals shown include unvalidated device data.  Last Pain:  Vitals:   01/03/19 1100  TempSrc: Oral  PainSc: 0-No pain         Complications: No apparent anesthesia complications

## 2019-01-03 NOTE — Op Note (Signed)
Preoperative diagnosis:acute cholecystitis Postoperative diagnosis: Same as above Procedure: Laparoscopic cholecystectomy Surgeon: Dr. Serita Grammes Anesthesia: General Specimens: Gallbladder and contents to pathology Complications: None Drains: None Sponge needle count was correct at completion Disposition to recovery stable condition  Indications: This is a 21 yof who had [place] with gallstones and cholecystitis last week. I saw her and placed her on antibiotics.  I scheduled her for laparoscopic cholecystectomy.   Procedure: After informed consent was obtained the patient underwent general anesthesia. Antibiotics had been given. SCDs were in place. She was then prepped and draped in the standard sterile surgical fashion. Surgical timeout was then performed.  I infiltrated Marcaine below the umbilicus. I made a vertical incision. I grasped the fascia and incised this sharply. The peritoneum was entered bluntly. I then placed a 0 Vicryl pursestring suture through the fascia. I inserted a Hassan trocar and insufflated the abdomen to 15 mmHg pressure. I then placed 3 further 5 mm trocars in the epigastrium and right upper quadrant under direct vision without complication. She was noted to have acute cholecystitis. Her gallbladder was retracted cephalad and lateral. I was able to dissect the triangle and get the critical view of safety. I then clipped the cystic duct leaving two clips in place. These clips completely traversed the duct and the duct was viable. I treated the cystic artery in a similar fashion. There was a smaller branch of the cystic artery that I also clipped and divided. Cautery was then used to remove the gallbladder from the liver bed. It was then placed in a retrieval bag and removed the umbilicus. Hemostasis was observed. There was no spillage of bile. I then placed and I then removed the [facility] trocar and tied down my pursestring. I did place an 2 additional 0  Vicryl sutures using the suture passer device at the umbilicus. The remaining trocars were removed and the abdomen was desufflated. These were all closed with 4-0 Monocryl, glue, and Steri-Strips. She tolerated this well was extubated and transferred to recovery stable.

## 2019-01-03 NOTE — Anesthesia Procedure Notes (Signed)
Procedure Name: Intubation Date/Time: 01/03/2019 12:40 PM Performed by: Jonna Munro, CRNA Pre-anesthesia Checklist: Patient identified, Emergency Drugs available, Suction available, Patient being monitored and Timeout performed Patient Re-evaluated:Patient Re-evaluated prior to induction Oxygen Delivery Method: Circle system utilized Preoxygenation: Pre-oxygenation with 100% oxygen Induction Type: IV induction Ventilation: Mask ventilation without difficulty Laryngoscope Size: Mac and 3 Grade View: Grade I Tube type: Oral Tube size: 7.0 mm Number of attempts: 1 Airway Equipment and Method: Stylet Placement Confirmation: ETT inserted through vocal cords under direct vision,  positive ETCO2 and breath sounds checked- equal and bilateral Secured at: 22 cm Tube secured with: Tape Dental Injury: Teeth and Oropharynx as per pre-operative assessment

## 2019-01-03 NOTE — Interval H&P Note (Signed)
History and Physical Interval Note:  01/03/2019 12:22 PM  Dawn Meadows  has presented today for surgery, with the diagnosis of GALLSTONES.  The various methods of treatment have been discussed with the patient and family. After consideration of risks, benefits and other options for treatment, the patient has consented to  Procedure(s) with comments: LAPAROSCOPIC CHOLECYSTECTOMY (N/A) - TAP BLOCK as a surgical intervention.  The patient's history has been reviewed, patient examined, no change in status, stable for surgery.  I have reviewed the patient's chart and labs.  Questions were answered to the patient's satisfaction.     Rolm Bookbinder

## 2019-01-03 NOTE — Anesthesia Preprocedure Evaluation (Addendum)
Anesthesia Evaluation  Patient identified by MRN, date of birth, ID band Patient awake    Reviewed: Allergy & Precautions, NPO status , Patient's Chart, lab work & pertinent test results  History of Anesthesia Complications (+) PONV and history of anesthetic complications  Airway Mallampati: II  TM Distance: >3 FB Neck ROM: Full    Dental  (+) Dental Advisory Given   Pulmonary neg pulmonary ROS,    Pulmonary exam normal        Cardiovascular hypertension, Normal cardiovascular exam     Neuro/Psych  Headaches, PSYCHIATRIC DISORDERS Anxiety    GI/Hepatic Neg liver ROS, GERD  Medicated and Controlled,  Endo/Other  negative endocrine ROS  Renal/GU  Kidney stones      Musculoskeletal negative musculoskeletal ROS (+)   Abdominal   Peds  Hematology negative hematology ROS (+)   Anesthesia Other Findings   Reproductive/Obstetrics                            Anesthesia Physical Anesthesia Plan  ASA: II  Anesthesia Plan: General   Post-op Pain Management:    Induction: Intravenous  PONV Risk Score and Plan: 4 or greater and Treatment may vary due to age or medical condition, Ondansetron, Dexamethasone, Midazolam, Propofol infusion and Scopolamine patch - Pre-op  Airway Management Planned: Oral ETT  Additional Equipment: None  Intra-op Plan:   Post-operative Plan: Extubation in OR  Informed Consent: I have reviewed the patients History and Physical, chart, labs and discussed the procedure including the risks, benefits and alternatives for the proposed anesthesia with the patient or authorized representative who has indicated his/her understanding and acceptance.     Dental advisory given  Plan Discussed with: CRNA and Anesthesiologist  Anesthesia Plan Comments: (Discussed TAP block with patient. Patient prefers to forego preop TAP block and would prefer local injection done at time  of surgery while under anesthesia.)     Anesthesia Quick Evaluation

## 2019-01-03 NOTE — Discharge Instructions (Signed)
[facility] LAPAROSCOPIC SURGERY: POST OP INSTRUCTIONS  Always review your discharge instruction sheet given to you by the facility where your surgery was performed. IF YOU HAVE DISABILITY OR FAMILY LEAVE FORMS, YOU MUST BRING THEM TO THE OFFICE FOR PROCESSING.   DO NOT GIVE THEM TO YOUR DOCTOR.  1. A prescription for pain medication may be given to you upon discharge.  Take your pain medication as prescribed, if needed.  If narcotic pain medicine is not needed, then you may take acetaminophen (Tylenol), naprosyn (Alleve), or ibuprofen (Advil) as needed. 2. Take your usually prescribed medications unless otherwise directed. 3. If you need a refill on your pain medication, please contact your pharmacy.  They will contact our office to request authorization. Prescriptions will not be filled after 5pm or on week-ends. 4. You should follow a light diet the first few days after arrival home, such as soup and crackers, etc.  Be sure to include lots of fluids daily. 5. Most patients will experience some swelling and bruising in the area of the incisions.  Ice packs will help.  Swelling and bruising can take several days to resolve.  6. It is common to experience some constipation if taking pain medication after surgery.  Increasing fluid intake and taking a stool softener (such as Colace) will usually help or prevent this problem from occurring.  A mild laxative (Milk of Magnesia or Miralax) should be taken according to package instructions if there are no bowel movements after 48 hours. 7. Unless discharge instructions indicate otherwise, you may remove your bandages 48 hours after surgery, and you may shower at that time.  You may have steri-strips (small skin tapes) in place directly over the incision.  These strips should be left on the skin for 7-10 days.  If your surgeon used skin glue on the incision, you may shower in 24 hours.  The glue will flake  off over the next 2-3 weeks.  Any sutures or staples will be removed at the office during your follow-up visit. 8. ACTIVITIES:  You may resume regular (light) daily activities beginning the next day--such as daily self-care, walking, climbing stairs--gradually increasing activities as tolerated.  You may have sexual intercourse when it is comfortable.  Refrain from any heavy lifting or straining until approved by your doctor. a. You may drive when you are no longer taking prescription pain medication, you can comfortably wear a seatbelt, and you can safely maneuver your car and apply brakes. b. RETURN TO WORK:  __________________________________________________________ 9. You should see your doctor in the office for a follow-up appointment approximately 2-3 weeks after your surgery.  Make sure that you call for this appointment within a day or two after you arrive home to insure a convenient appointment time. 10. OTHER INSTRUCTIONS: __________________________________________________________________________________________________________________________ __________________________________________________________________________________________________________________________ WHEN TO CALL YOUR DOCTOR: 1. Fever over 101.0 2. Inability to urinate 3. Continued bleeding from incision. 4. Increased pain, redness, or drainage from the incision. 5. Increasing abdominal pain  The clinic staff is available to answer your questions during regular business hours.  Please dont hesitate to call and ask to speak to one of the nurses for clinical concerns.  If you have a medical emergency, go to the nearest emergency room or call 911.  A surgeon from [facility] is always on call at the hospital. [address], [place], [place], [place]  [address] ? [address], [place], [place]   [address] [phone] ? [phone] ? FAX [phone]  [phone] Web site: [email or web address]    Post  Anesthesia Home Care Instructions  Activity: Get plenty of rest for the remainder of the day. A responsible individual must stay with you for 24 hours following the procedure.  For the next 24 hours, DO NOT: -Drive a car -Paediatric nurse -Drink alcoholic beverages -Take any medication unless instructed by your physician -Make any legal decisions or sign important papers.  Meals: Start with liquid foods such as gelatin or soup. Progress to regular foods as tolerated. Avoid greasy, spicy, heavy foods. If nausea and/or vomiting occur, drink only clear liquids until the nausea and/or vomiting subsides. Call your physician if vomiting continues.  Special Instructions/Symptoms: Your throat may feel dry or sore from the anesthesia or the breathing tube placed in your throat during surgery. If this causes discomfort, gargle with warm salt water. The discomfort should disappear within 24 hours.  If you had a scopolamine patch placed behind your ear for the management of post- operative nausea and/or vomiting:  1. The medication in the patch is effective for 72 hours, after which it should be removed.  Wrap patch in a tissue and discard in the trash. Wash hands thoroughly with soap and water. 2. You may remove the patch earlier than 72 hours if you experience unpleasant side effects which may include dry mouth, dizziness or visual disturbances. 3. Avoid touching the patch. Wash your hands with soap and water after contact with the patch.    Call your surgeon if you experience:   1.  Fever over 101.0. 2.  Inability to urinate. 3.  Nausea and/or vomiting. 4.  Extreme swelling or bruising at the surgical site. 5.  Continued bleeding from the incision. 6.  Increased pain, redness or drainage from the incision. 7.  Problems related to your pain medication. 8.  Any problems and/or concerns  Took toradol at 11pm. No motrin like products till after 5pm

## 2019-01-04 ENCOUNTER — Encounter (HOSPITAL_BASED_OUTPATIENT_CLINIC_OR_DEPARTMENT_OTHER): Payer: Self-pay | Admitting: General Surgery

## 2019-01-06 ENCOUNTER — Ambulatory Visit: Payer: 59 | Admitting: Plastic Surgery

## 2019-02-17 ENCOUNTER — Ambulatory Visit (INDEPENDENT_AMBULATORY_CARE_PROVIDER_SITE_OTHER): Payer: Self-pay | Admitting: Plastic Surgery

## 2019-02-17 ENCOUNTER — Other Ambulatory Visit (HOSPITAL_COMMUNITY)
Admission: RE | Admit: 2019-02-17 | Discharge: 2019-02-17 | Disposition: A | Discharge status: Home / Self Care | Payer: 59 | Source: Ambulatory Visit | Attending: Plastic Surgery | Admitting: Plastic Surgery

## 2019-02-17 ENCOUNTER — Other Ambulatory Visit: Payer: Self-pay

## 2019-02-17 ENCOUNTER — Encounter: Payer: Self-pay | Admitting: Plastic Surgery

## 2019-02-17 ENCOUNTER — Ambulatory Visit: Payer: 59 | Admitting: Plastic Surgery

## 2019-02-17 VITALS — BP 126/83 | HR 70 | Temp 97.5°F | Resp 16 | Ht 66.0 in | Wt 153.0 lb

## 2019-02-17 DIAGNOSIS — L989 Disorder of the skin and subcutaneous tissue, unspecified: Secondary | ICD-10-CM | POA: Insufficient documentation

## 2019-02-17 DIAGNOSIS — Z719 Counseling, unspecified: Secondary | ICD-10-CM | POA: Insufficient documentation

## 2019-02-17 DIAGNOSIS — R2240 Localized swelling, mass and lump, unspecified lower limb: Secondary | ICD-10-CM | POA: Insufficient documentation

## 2019-02-17 DIAGNOSIS — R2242 Localized swelling, mass and lump, left lower limb: Secondary | ICD-10-CM

## 2019-02-17 NOTE — Progress Notes (Signed)
Procedure Note  Preoperative Dx: skin tag back 5 mm  and neck 3 mm  Postoperative Dx: Same  Procedure: excision of skin tag back and neck  Anesthesia: Lidocaine 1% with 1:100,000 epinepherine   Description of Procedure: Risks and complications were explained to the patient.  Consent was confirmed and the patient understands the risks and benefits.  The potential complications and alternatives were explained and the patient consents.  The patient expressed understanding the option of not having the procedure and the risks of a scar.  Time out was called and all information was confirmed to be correct.    Back:  The area was prepped and drapped.  Lidocaine 1% with epinepherine was injected in the subcutaneous area.  After waiting several minutes for the local to take affect a #15 blade was used to excise the area.  A 5-0 Monocryl was used to close the skin edges.    Neck:  The area was prepped and drapped.  Lidocaine 1% with epinepherine was injected in the subcutaneous area.  After waiting several minutes for the local to take affect a #15 blade was used to excise the area in an elipse.  A 5-0 Monocryl was used to close the skin edges.  Steri strips were applied and a dressing.  The patient was given instructions on how to care for the area and a follow up appointment.  Jaquelynn tolerated the procedure well and there were no complications. The patient agreed she did not want the benign looking lesions sent to pathology.

## 2019-02-17 NOTE — Progress Notes (Signed)
Procedure Note  Preoperative Dx:  changing skin lesion of forehead and chin Mass of left leg  Postoperative Dx: Same  Procedure:  Excision of changing skin lesion of right forehead 4 mm Excision of changing skin lesion of right chin 7 mm Excision of mass left leg 10 mm  Anesthesia: Lidocaine 1% with 1:100,000 epinepherine   Description of Procedure: Risks and complications were explained to the patient.  Consent was confirmed and the patient understands the risks and benefits.  The potential complications and alternatives were explained and the patient consents.  The patient expressed understanding the option of not having the procedure and the risks of a scar.  Time out was called and all information was confirmed to be correct.    Right forehead: The area was prepped and drapped.  Lidocaine 1% with epinepherine was injected in the subcutaneous area.  After waiting several minutes for the local to take affect a #15 blade was used to excise the 4 mm area in an eliptical pattern.  A 6-0 Monocryl was used to close the skin edges.    Right Chin: The area was prepped and drapped.  Lidocaine 1% with epinepherine was injected in the subcutaneous area.  After waiting several minutes for the local to take affect a #15 blade was used to excise the 7 mm area in an eliptical pattern.  A 6-0 Monocryl was used to close the skin edges.  Left leg:  The area was prepped and drapped.  Lidocaine 1% with epinepherine was injected in the subcutaneous area.  After waiting several minutes for the local to take affect a #15 blade was used to excise the area in an eliptical pattern.  The tissue scissors were used to dissect down and release the entire 10 mm mass from the surrounding tissue.   A 5-0 Monocryl was used to close the deep layers with simple interrupted stitches.  The skin edges were reapproximated with 5-0 Monocryl subcuticular running closure.    A dressing was applied to all areas with steri strips.   The patient was given instructions on how to care for the area and a follow up appointment.  Dawn Meadows tolerated the procedure well and there were no complications. The specimens were sent to pathology.

## 2019-02-21 ENCOUNTER — Telehealth: Payer: Self-pay

## 2019-02-21 LAB — SURGICAL PATHOLOGY

## 2019-02-21 MED ORDER — DOXYCYCLINE HYCLATE 100 MG PO TABS: 100.0000 mg | ORAL_TABLET | Freq: Two times a day (BID) | ORAL | 0 refills | Status: AC

## 2019-02-21 NOTE — Telephone Encounter (Signed)
Pt called back and wanted to know if she should put anything on it or leave it out. It's red, swollen and filled with pus. Please call back to advise.

## 2019-02-21 NOTE — Telephone Encounter (Signed)
Received call from patient with some concerns about procedure site from last week. Please contact and advise

## 2019-02-21 NOTE — Telephone Encounter (Signed)
Patient called reporting redness and purulent drainage of her right lower lip excision site. She reports she took the steri-strip off and noticed it.   No fever, chills, n/v. Empiric abx sent to pharmacy.  Patient to call if sx worsen or do not improve. She verbalized understanding.

## 2019-03-03 ENCOUNTER — Ambulatory Visit (INDEPENDENT_AMBULATORY_CARE_PROVIDER_SITE_OTHER): Payer: 59 | Admitting: Plastic Surgery

## 2019-03-03 ENCOUNTER — Other Ambulatory Visit: Payer: Self-pay

## 2019-03-03 ENCOUNTER — Encounter: Payer: Self-pay | Admitting: Surgical

## 2019-03-03 DIAGNOSIS — D3610 Benign neoplasm of peripheral nerves and autonomic nervous system, unspecified: Secondary | ICD-10-CM

## 2019-03-03 DIAGNOSIS — D239 Other benign neoplasm of skin, unspecified: Secondary | ICD-10-CM | POA: Insufficient documentation

## 2019-03-03 NOTE — Progress Notes (Signed)
The patient is a 33 year old female here for follow-up after undergoing excision of several skin lesions.  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  There is slight redness and irritation around each excision site but nothing worrisome.  Overall doing well.  She has been putting vitamin E and I think this was creating some skin irritation.  I have encouraged her no more vitamin E and Vaseline instead follow-up as needed.  Sutures removed.

## 2019-04-17 ENCOUNTER — Ambulatory Visit: Payer: 59 | Admitting: Podiatry

## 2019-04-17 ENCOUNTER — Encounter: Payer: Self-pay | Admitting: Podiatry

## 2019-04-17 ENCOUNTER — Ambulatory Visit (INDEPENDENT_AMBULATORY_CARE_PROVIDER_SITE_OTHER): Payer: 59

## 2019-04-17 ENCOUNTER — Other Ambulatory Visit: Payer: Self-pay

## 2019-04-17 VITALS — BP 141/98 | HR 84 | Resp 16

## 2019-04-17 DIAGNOSIS — M21619 Bunion of unspecified foot: Secondary | ICD-10-CM

## 2019-04-17 DIAGNOSIS — M2021 Hallux rigidus, right foot: Secondary | ICD-10-CM | POA: Diagnosis not present

## 2019-04-17 DIAGNOSIS — M79671 Pain in right foot: Secondary | ICD-10-CM

## 2019-04-17 DIAGNOSIS — N393 Stress incontinence (female) (male): Secondary | ICD-10-CM | POA: Insufficient documentation

## 2019-04-17 MED ORDER — DICLOFENAC SODIUM 1 % EX GEL: 2.0000 g | Freq: Four times a day (QID) | CUTANEOUS | 2 refills | Status: AC

## 2019-04-17 NOTE — Patient Instructions (Signed)
Bunion  A bunion is a bump on the base of the big toe that forms when the bones of the big toe joint move out of position. Bunions may be small at first, but they often get larger over time. They can make walking painful. What are the causes? A bunion may be caused by:  Wearing narrow or pointed shoes that force the big toe to press against the other toes.  Abnormal foot development that causes the foot to roll inward (pronate).  Changes in the foot that are caused by certain diseases, such as rheumatoid arthritis or polio.  A foot injury. What increases the risk? The following factors may make you more likely to develop this condition:  Wearing shoes that squeeze the toes together.  Having certain diseases, such as: ? Rheumatoid arthritis. ? Polio. ? Cerebral palsy.  Having family members who have bunions.  Being born with a foot deformity, such as flat feet or low arches.  Doing activities that put a lot of pressure on the feet, such as ballet dancing. What are the signs or symptoms? The main symptom of a bunion is a noticeable bump on the big toe. Other symptoms may include:  Pain.  Swelling around the big toe.  Redness and inflammation.  Thick or hardened skin on the big toe or between the toes.  Stiffness or loss of motion in the big toe.  Trouble with walking. How is this diagnosed? A bunion may be diagnosed based on your symptoms, medical history, and activities. You may have tests, such as:  X-rays. These allow your health care provider to check the position of the bones in your foot and look for damage to your joint. They also help your health care provider determine the severity of your bunion and the best way to treat it.  Joint aspiration. In this test, a sample of fluid is removed from the toe joint. This test may be done if you are in a lot of pain. It helps rule out diseases that cause painful swelling of the joints, such as arthritis. How is this  treated? Treatment depends on the severity of your symptoms. The goal of treatment is to relieve symptoms and prevent the bunion from getting worse. Your health care provider may recommend:  Wearing shoes that have a wide toe box.  Using bunion pads to cushion the affected area.  Taping your toes together to keep them in a normal position.  Placing a device inside your shoe (orthotics) to help reduce pressure on your toe joint.  Taking medicine to ease pain, inflammation, and swelling.  Applying heat or ice to the affected area.  Doing stretching exercises.  Surgery to remove scar tissue and move the toes back into their normal position. This treatment is rare. Follow these instructions at home: Managing pain, stiffness, and swelling   If directed, put ice on the painful area: ? Put ice in a plastic bag. ? Place a towel between your skin and the bag. ? Leave the ice on for 20 minutes, 2-3 times a day. Activity   If directed, apply heat to the affected area before you exercise. Use the heat source that your health care provider recommends, such as a moist heat pack or a heating pad. ? Place a towel between your skin and the heat source. ? Leave the heat on for 20-30 minutes. ? Remove the heat if your skin turns bright red. This is especially important if you are unable to feel pain,   heat, or cold. You may have a greater risk of getting burned.  Do exercises as told by your health care provider. General instructions  Support your toe joint with proper footwear, shoe padding, or taping as told by your health care provider.  Take over-the-counter and prescription medicines only as told by your health care provider.  Keep all follow-up visits as told by your health care provider. This is important. Contact a health care provider if your symptoms:  Get worse.  Do not improve in 2 weeks. Get help right away if you have:  Severe pain and trouble with walking. Summary  A  bunion is a bump on the base of the big toe that forms when the bones of the big toe joint move out of position.  Bunions can make walking painful.  Treatment depends on the severity of your symptoms.  Support your toe joint with proper footwear, shoe padding, or taping as told by your health care provider. This information is not intended to replace advice given to you by your health care provider. Make sure you discuss any questions you have with your health care provider. Document Released: 04/27/2005 Document Revised: 11/01/2017 Document Reviewed: 09/07/2017 [employer] Patient Education  2020 [employer]  

## 2019-04-18 ENCOUNTER — Institutional Professional Consult (permissible substitution): Payer: 59 | Admitting: Plastic Surgery

## 2019-04-23 NOTE — Progress Notes (Signed)
Subjective:   Patient ID: Dawn Meadows, female   DOB: 33 y.o.   MRN: [id number]   HPI 33 year old female presents the office today for concerns of a painful bunion on the right foot which has been ongoing for many years however has been worsening over the last 6 months.  She states that starting to go over the second toe.  It hurts with shoes and gets very red with irritation.  She has tried shoe modifications, offloading the insignificant improvement.  Review of Systems  All other systems reviewed and are negative.  Past Medical History:  Diagnosis Date  . Anxiety   . Dermatitis   . Essential hypertension    MONITORED BY PCP  . GERD (gastroesophageal reflux disease)    prilosec prn  . Headache(784.0)   . Hematuria   . History of gestational hypertension    PT HAS HYPERTENSION BUT EXACERBATION DURING PREGNANCY'S  . History of kidney stones    2012 at 38 wks per pt thinks passed spontaneous  . History of palpitations   . History of panic attacks   . PONV (postoperative nausea and vomiting)    per pt"has extreme anxiety about throwing up"  . Postpartum hypertension MONITORED BY OB/GYN- DR Marvel Plan   RECENT SVD 07-21-2016 -- DX POSTPARTUM PRE-CLAMPSIA PPD 4  . SVD (spontaneous vaginal delivery)    07-21-2016    Past Surgical History:  Procedure Laterality Date  . CHOLECYSTECTOMY N/A 01/03/2019   Procedure: LAPAROSCOPIC CHOLECYSTECTOMY;  Surgeon: Rolm Bookbinder, MD;  Location: [place];  Service: General;  Laterality: N/A;  . CYSTOSCOPY WITH RETROGRADE PYELOGRAM, URETEROSCOPY AND STENT PLACEMENT Bilateral 10/09/2016   Procedure: CYSTOSCOPY WITH RETROGRADE PYELOGRAM, URETEROSCOPY, STONE BASKETRY  AND STENT PLACEMENT;  Surgeon: Alexis Frock, MD;  Location: [facility];  Service: Urology;  Laterality: Bilateral;  . HOLMIUM LASER APPLICATION Bilateral [id number]   Procedure: HOLMIUM LASER APPLICATION;  Surgeon: Alexis Frock, MD;  Location:  [facility];  Service: Urology;  Laterality: Bilateral;  . TRANSTHORACIC ECHOCARDIOGRAM  07/11/2013   EF 55-60%/ trivial MR and PR  . WISDOM TOOTH EXTRACTION  2001     Current Outpatient Medications:  .  diclofenac Sodium (VOLTAREN) 1 % GEL, Apply 2 g topically 4 (four) times daily. Rub into affected area of foot 2 to 4 times daily, Disp: 100 g, Rfl: 2 .  etonogestrel-ethinyl estradiol (NUVARING) 0.12-0.015 MG/24HR vaginal ring, Place 1 each vaginally every 28 (twenty-eight) days. Insert vaginally and leave in place for 3 consecutive weeks, then remove for 1 week., Disp: , Rfl:  .  indapamide (LOZOL) 1.25 MG tablet, indapamide 1.25 mg tablet  TAKE 1 TABLET BY MOUTH EVERY DAY, Disp: , Rfl:  .  pantoprazole (PROTONIX) 40 MG tablet, Take 40 mg by mouth daily., Disp: , Rfl:  .  sertraline (ZOLOFT) 50 MG tablet, Take 1 tablet by mouth every morning. , Disp: , Rfl:   Allergies  Allergen Reactions  . Codeine Nausea And Vomiting  . Procardia [Nifedipine] Rash         Objective:  Physical Exam  General: AAO x3, NAD  Dermatological: Skin is warm, dry and supple bilateral. Nails x 10 are well manicured; remaining integument appears unremarkable at this time. There are no open sores, no preulcerative lesions, no rash or signs of infection present.  Vascular: Dorsalis Pedis artery and Posterior Tibial artery pedal pulses are 2/4 bilateral with immedate capillary fill time. Pedal hair growth present. No varicosities and no lower extremity  edema present bilateral. There is no pain with calf compression, swelling, warmth, erythema.   Neruologic: Grossly intact via light touch bilateral. Protective threshold with Semmes Wienstein monofilament intact to all pedal sites bilateral.  Musculoskeletal: Significant bunion deformities present on the right foot.  Hypermobility present of the first ray.  No pain with MPJ range of motion.  No other areas of discomfort.  Muscular strength 5/5 in  all groups tested bilateral.  Gait: Unassisted, Nonantalgic.       Assessment:   Symptomatic bunion deformity right foot    Plan:  -Treatment options discussed including all alternatives, risks, and complications -Etiology of symptoms were discussed -X-rays were obtained and reviewed with the patient.  Severe bunion deformity present.  No evidence of acute fracture. -We discussed both conservative as well as surgical treatment options.  She is attempted conservative treatment with a significant improvement.  We are going to do further offloading pads and toe separator's was dispensed today.  We also discussed surgical intervention.  Discussed Lapidus bunionectomy (likley Lapiplasty).  She was having surgery this year but given her child just recently broke their arm she is can have to delay this until after the first of the year.  She will consider her options and let us know if she wants to proceed with surgery.  No follow-ups on file.  Trula Slade DPM

## 2019-05-30 ENCOUNTER — Encounter: Payer: Self-pay | Admitting: Plastic Surgery

## 2019-05-30 ENCOUNTER — Other Ambulatory Visit: Payer: Self-pay

## 2019-05-30 ENCOUNTER — Ambulatory Visit (INDEPENDENT_AMBULATORY_CARE_PROVIDER_SITE_OTHER): Payer: 59 | Admitting: Plastic Surgery

## 2019-05-30 VITALS — BP 137/84 | HR 76 | Temp 97.5°F | Ht 66.0 in | Wt 156.0 lb

## 2019-05-30 DIAGNOSIS — Z719 Counseling, unspecified: Secondary | ICD-10-CM

## 2019-05-30 NOTE — Progress Notes (Signed)
Preoperative Dx: Redness of left leg excision site and hyperpigmentation of chin excision site  Postoperative Dx:  same  Procedure: laser to chin and left leg  Anesthesia: none  Description of Procedure:  Risks and complications were explained to the patient. Consent was confirmed and signed. Time out was called and all information was confirmed to be correct. The area  area was prepped with alcohol and wiped dry. The IPL laser was set at 7.4 J/cm2. The left leg was lasered.  The fractional was used on the chin.  The patient tolerated the procedure well and there were no complications. The patient is to follow up in 4 weeks as able. Lets see how the laser does for the area.  I think in time both areas will improve a great deal.

## 2019-06-22 NOTE — Patient Instructions (Addendum)
Get your Covid test on 06/26/19. Go to the cement overhang at [place] at 2:15 pm   Dawn Meadows       Your procedure is scheduled on 06/29/19   Report to [facility]  at 6:30 A.M.   Call this number if you have problems the morning of [phone]   OUR ADDRESS IS [place], WE ARE LOCATED IN THE MEDICAL PLAZA WITH [facility].   Remember:  Do not eat food or drink liquids after midnight.   Take these medicines the morning of surgery with A SIP OF WATER: Zoloft, Protonix   Do not wear jewelry, make-up or nail polish.  Do not wear lotions, powders, or perfumes, or deoderant.  Do not shave 48 hours prior to surgery.    Do not bring valuables to the hospital.  [facility] is not responsible for any belongings or valuables.  Contacts, dentures or bridgework may not be worn into surgery.  Leave your suitcase in the car.    For patients admitted to the hospital, discharge time will be determined by your treatment team.  Patients discharged the day of surgery will not be allowed to drive home.   Special instructions:    Please read over the following fact sheets that you were given:       [facility] - Preparing for Surgery  Before surgery, you can play an important role.   Because skin is not sterile, your skin needs to be as free of germs as possible.   You can reduce the number of germs on your skin by washing with CHG (chlorahexidine gluconate) soap before surgery.   CHG is an antiseptic cleaner which kills germs and bonds with the skin to continue killing germs even after washing. Please DO NOT use if you have an allergy to CHG or antibacterial soaps.   If your skin becomes reddened/irritated stop using the CHG and inform your nurse when you arrive at Short Stay. Do not shave (including legs and underarms) for at least 48 hours prior to the first CHG shower.   Please follow these instructions carefully:  1.  Shower  with CHG Soap the night before surgery and the  morning of Surgery.  2.  If you choose to wash your hair, wash your hair first as usual with your  normal  shampoo.  3.  After you shampoo, rinse your hair and body thoroughly to remove the  shampoo.                                        4.  Use CHG as you would any other liquid soap.  You can apply chg directly  to the skin and wash                       Gently with a scrungie or clean washcloth.  5.  Apply the CHG Soap to your body ONLY FROM THE NECK DOWN.   Do not use on face/ open                           Wound or open sores. Avoid contact with eyes, ears mouth and genitals (private parts).  Wash face,  Genitals (private parts) with your normal soap.             6.  Wash thoroughly, paying special attention to the area where your surgery  will be performed.  7.  Thoroughly rinse your body with warm water from the neck down.  8.  DO NOT shower/wash with your normal soap after using and rinsing off  the CHG Soap.             9.  Pat yourself dry with a clean towel.            10.  Wear clean pajamas.            11.  Place clean sheets on your bed the night of your first shower and do not  sleep with pets. Day of Surgery : Do not apply any lotions/deodorants the morning of surgery.  Please wear clean clothes to the hospital/surgery center.  FAILURE TO FOLLOW THESE INSTRUCTIONS MAY RESULT IN THE CANCELLATION OF YOUR SURGERY PATIENT SIGNATURE_________________________________  NURSE SIGNATURE__________________________________  ________________________________________________________________________   Dawn Meadows  An incentive spirometer is a tool that can help keep your lungs clear and active. This tool measures how well you are filling your lungs with each breath. Taking long deep breaths may help reverse or decrease the chance of developing breathing (pulmonary) problems (especially infection) following:  A  long period of time when you are unable to move or be active. BEFORE THE PROCEDURE   If the spirometer includes an indicator to show your best effort, your nurse or respiratory therapist will set it to a desired goal.  If possible, sit up straight or lean slightly forward. Try not to slouch.  Hold the incentive spirometer in an upright position. INSTRUCTIONS FOR USE  1. Sit on the edge of your bed if possible, or sit up as far as you can in bed or on a chair. 2. Hold the incentive spirometer in an upright position. 3. Breathe out normally. 4. Place the mouthpiece in your mouth and seal your lips tightly around it. 5. Breathe in slowly and as deeply as possible, raising the piston or the ball toward the top of the column. 6. Hold your breath for 3-5 seconds or for as long as possible. Allow the piston or ball to fall to the bottom of the column. 7. Remove the mouthpiece from your mouth and breathe out normally. 8. Rest for a few seconds and repeat Steps 1 through 7 at least 10 times every 1-2 hours when you are awake. Take your time and take a few normal breaths between deep breaths. 9. The spirometer may include an indicator to show your best effort. Use the indicator as a goal to work toward during each repetition. 10. After each set of 10 deep breaths, practice coughing to be sure your lungs are clear. If you have an incision (the cut made at the time of surgery), support your incision when coughing by placing a pillow or rolled up towels firmly against it. Once you are able to get out of bed, walk around indoors and cough well. You may stop using the incentive spirometer when instructed by your caregiver.  RISKS AND COMPLICATIONS  Take your time so you do not get dizzy or light-headed.  If you are in pain, you may need to take or ask for pain medication before doing incentive spirometry. It is harder to take a deep breath if you are having pain. AFTER USE  Rest and breathe slowly and  easily.  It can be helpful to keep track of a log of your progress. Your caregiver can provide you with a simple table to help with this. If you are using the spirometer at home, follow these instructions: [place] IF:   You are having difficultly using the spirometer.  You have trouble using the spirometer as often as instructed.  Your pain medication is not giving enough relief while using the spirometer.  You develop fever of 100.5 F (38.1 C) or higher. SEEK IMMEDIATE MEDICAL CARE IF:   You cough up bloody sputum that had not been present before.  You develop fever of 102 F (38.9 C) or greater.  You develop worsening pain at or near the incision site. MAKE SURE YOU:   Understand these instructions.  Will watch your condition.  Will get help right away if you are not doing well or get worse. Document Released: 09/07/2006 Document Revised: 07/20/2011 Document Reviewed: 11/08/2006 [facility] Patient Information 2014 [place], [place].   ________________________________________________________________________

## 2019-06-22 NOTE — H&P (Addendum)
Dawn Meadows is an 34 y.o. female [phone] presenting for LAVH/BS/cystoscopy for issues with menorrhagia and dysmenorrhea and inability to continue hormonal treatment.  Her symptoms improved on nuvaring but has had some issues with increasing BP lately related to the hormones and wants to come off hormonal therapy. She wants definitive therapy with hysterectomy. Still has some menstrual HA's. Has not been on BP meds since 6 months ago and wants to stay off. 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   Pertinent Gynecological History:  OB History: NSVD x 2   Menstrual History:  Patient's last menstrual period was 06/21/2019.    Past Medical History:  Diagnosis Date  . Anxiety   . Dermatitis   . Essential hypertension    MONITORED BY PCP  . GERD (gastroesophageal reflux disease)    prilosec prn  . Headache(784.0)   . Hematuria   . History of gestational hypertension    PT HAS HYPERTENSION BUT EXACERBATION DURING PREGNANCY'S  . History of kidney stones    2012 at 38 wks per pt thinks passed spontaneous  . History of palpitations   . History of panic attacks   . PONV (postoperative nausea and vomiting)    per pt"has extreme anxiety about throwing up"  . Postpartum hypertension MONITORED BY OB/GYN- DR Marvel Plan   RECENT SVD 07-21-2016 -- DX POSTPARTUM PRE-CLAMPSIA PPD 4  . SVD (spontaneous vaginal delivery)    07-21-2016    Past Surgical History:  Procedure Laterality Date  . CHOLECYSTECTOMY N/A 01/03/2019   Procedure: LAPAROSCOPIC CHOLECYSTECTOMY;  Surgeon: Rolm Bookbinder, MD;  Location: [place];  Service: General;  Laterality: N/A;  . CYSTOSCOPY WITH RETROGRADE PYELOGRAM, URETEROSCOPY AND STENT PLACEMENT Bilateral 10/09/2016   Procedure: CYSTOSCOPY WITH RETROGRADE PYELOGRAM, URETEROSCOPY, STONE BASKETRY  AND STENT PLACEMENT;  Surgeon: Alexis Frock, MD;  Location: [facility];  Service: Urology;  Laterality: Bilateral;  . HOLMIUM LASER  APPLICATION Bilateral [id number]   Procedure: HOLMIUM LASER APPLICATION;  Surgeon: Alexis Frock, MD;  Location: [facility];  Service: Urology;  Laterality: Bilateral;  . TRANSTHORACIC ECHOCARDIOGRAM  07/11/2013   EF 55-60%/ trivial MR and PR  . WISDOM TOOTH EXTRACTION  2001    Family History  Problem Relation Age of Onset  . Breast cancer Maternal Grandmother   . Lung cancer Maternal Grandmother   . Hypotension Neg Hx   . Malignant hyperthermia Neg Hx   . Pseudochol deficiency Neg Hx   . Anesthesia problems Neg Hx     Social History:  reports that she has never smoked. She has never used smokeless tobacco. She reports that she does not drink alcohol or use drugs.  Allergies:  Allergies  Allergen Reactions  . Codeine Nausea And Vomiting  . Procardia [Nifedipine] Rash    No medications prior to admission.    Review of Systems  Constitutional: Negative for fever.  Genitourinary:       Painful, heavy menses    Last menstrual period 06/21/2019. Physical Exam  Constitutional: She appears well-developed.  Cardiovascular: Normal rate and regular rhythm.  Respiratory: Effort normal.  GI: Soft.  Genitourinary:    Vagina and uterus normal.   Neurological: She is alert.  Psychiatric: She has a normal mood and affect.    No results found for this or any previous visit (from the past 24 hour(s)).  No results found.  Assessment/Plan: The patient was counseled regarding the risks of laparoscopic assisted vaginal hysterectomy, with bilateral salpingectomies.  The procedure  was reviewed in detail and expectations regarding recovery. Risks of bleeding, infection and possible damage to bowel and bladder were reviewed. The patient understands that should a complication arise she would likely need a larger abdominal incision and that this would delay her recovery. She would accept a blood transfusion if needed. We also discussed removal of the fallopian tubes as a means  of possibly reducing future risk of ovarian cancer and she is agreeable to this. She will retain her ovaries unless there is obvious pathology the would warrant removal. She is ready to proceed. We discussed that she has no significant prolapse and that her mild SUI will not likely be altered with this surgery, but that we could have her evaluated for this by urology if she felt needed to be addressed. She declines any treatment at this time.  Logan Bores 06/28/2019, 5:15 PM

## 2019-06-23 ENCOUNTER — Encounter (HOSPITAL_COMMUNITY): Payer: Self-pay

## 2019-06-23 ENCOUNTER — Other Ambulatory Visit: Payer: Self-pay

## 2019-06-23 ENCOUNTER — Encounter (HOSPITAL_COMMUNITY)
Admission: RE | Admit: 2019-06-23 | Discharge: 2019-06-23 | Disposition: A | Discharge status: Home / Self Care | Payer: 59 | Source: Ambulatory Visit | Attending: Obstetrics and Gynecology | Admitting: Obstetrics and Gynecology

## 2019-06-23 NOTE — Progress Notes (Signed)
PCP - Nicholes Rough PA Cardiologist - Dr. Meda Coffee cardiac workup for palpitations.  Findings were anxiety  Chest x-ray - no EKG - 01/02/19 Stress Test - no ECHO - 2015 Cardiac Cath - no  Sleep Study - no CPAP - no  Fasting Blood Sugar - NA Checks Blood Sugar _____ times a day  Blood Thinner Instructions:NA Aspirin Instructions: Last Dose:  Anesthesia review:   Patient denies shortness of breath, fever, cough and chest pain at PAT appointment  yes Patient verbalized understanding of instructions that were given to them at the PAT appointment. Patient was also instructed that they will need to review over the PAT instructions again at home before surgery. Yes  Pt states that she is on Lozol for kidney stone management no hypertension.She did have pre eclampsia.

## 2019-06-26 ENCOUNTER — Encounter (HOSPITAL_COMMUNITY)
Admission: RE | Admit: 2019-06-26 | Discharge: 2019-06-26 | Disposition: A | Discharge status: Home / Self Care | Payer: 59 | Source: Ambulatory Visit | Attending: Obstetrics and Gynecology | Admitting: Obstetrics and Gynecology

## 2019-06-26 ENCOUNTER — Other Ambulatory Visit (HOSPITAL_COMMUNITY)
Admission: RE | Admit: 2019-06-26 | Discharge: 2019-06-26 | Disposition: A | Discharge status: Home / Self Care | Payer: 59 | Source: Ambulatory Visit | Attending: Obstetrics and Gynecology | Admitting: Obstetrics and Gynecology

## 2019-06-26 ENCOUNTER — Other Ambulatory Visit: Payer: Self-pay

## 2019-06-26 DIAGNOSIS — Z20822 Contact with and (suspected) exposure to covid-19: Secondary | ICD-10-CM | POA: Insufficient documentation

## 2019-06-26 DIAGNOSIS — Z01812 Encounter for preprocedural laboratory examination: Secondary | ICD-10-CM | POA: Insufficient documentation

## 2019-06-26 DIAGNOSIS — N946 Dysmenorrhea, unspecified: Secondary | ICD-10-CM | POA: Diagnosis not present

## 2019-06-26 DIAGNOSIS — N920 Excessive and frequent menstruation with regular cycle: Secondary | ICD-10-CM | POA: Insufficient documentation

## 2019-06-26 LAB — CBC
HCT: 44.3 % (ref 36.0–46.0)
Hemoglobin: 14.6 g/dL (ref 12.0–15.0)
MCH: 29.6 pg (ref 26.0–34.0)
MCHC: 33 g/dL (ref 30.0–36.0)
MCV: 89.7 fL (ref 80.0–100.0)
Platelets: 313 10*3/uL (ref 150–400)
RBC: 4.94 MIL/uL (ref 3.87–5.11)
RDW: 12.4 % (ref 11.5–15.5)
WBC: 9.3 10*3/uL (ref 4.0–10.5)
nRBC: 0 % (ref 0.0–0.2)

## 2019-06-26 LAB — BASIC METABOLIC PANEL
Anion gap: 11 (ref 5–15)
BUN: 20 mg/dL (ref 6–20)
CO2: 28 mmol/L (ref 22–32)
Calcium: 9.8 mg/dL (ref 8.9–10.3)
Chloride: 101 mmol/L (ref 98–111)
Creatinine, Ser: 0.86 mg/dL (ref 0.44–1.00)
GFR calc Af Amer: >60 mL/min (ref >60)
GFR calc non Af Amer: >60 mL/min (ref >60)
Glucose, Bld: 109 mg/dL — ABNORMAL HIGH (ref 70–99)
Potassium: 3.8 mmol/L (ref 3.5–5.1)
Sodium: 140 mmol/L (ref 135–145)

## 2019-06-26 LAB — SARS CORONAVIRUS 2 (TAT 6-24 HRS): SARS Coronavirus 2: NEGATIVE

## 2019-06-26 LAB — ABO/RH: ABO/RH(D): O POS

## 2019-06-28 NOTE — Anesthesia Preprocedure Evaluation (Addendum)
Anesthesia Evaluation  Patient identified by MRN, date of birth, ID band Patient awake    Reviewed: Allergy & Precautions, NPO status , Patient's Chart, lab work & pertinent test results  History of Anesthesia Complications (+) PONV and history of anesthetic complications  Airway Mallampati: II  TM Distance: >3 FB Neck ROM: Full    Dental no notable dental hx.    Pulmonary neg pulmonary ROS,    Pulmonary exam normal breath sounds clear to auscultation       Cardiovascular hypertension, Normal cardiovascular exam Rhythm:Regular Rate:Normal  ECG: NSR, rate 75   Neuro/Psych  Headaches, Anxiety    GI/Hepatic Neg liver ROS, GERD  Medicated and Controlled,  Endo/Other  negative endocrine ROS  Renal/GU negative Renal ROS     Musculoskeletal negative musculoskeletal ROS (+)   Abdominal   Peds  Hematology negative hematology ROS (+)   Anesthesia Other Findings menorrhagia, dysmenorrhea  Reproductive/Obstetrics                            Anesthesia Physical Anesthesia Plan  ASA: II  Anesthesia Plan: General   Post-op Pain Management:    Induction: Intravenous  PONV Risk Score and Plan: 4 or greater and Scopolamine patch - Pre-op, Midazolam, Propofol infusion, Dexamethasone, Ondansetron and Treatment may vary due to age or medical condition  Airway Management Planned: Oral ETT  Additional Equipment:   Intra-op Plan:   Post-operative Plan: Extubation in OR  Informed Consent: I have reviewed the patients History and Physical, chart, labs and discussed the procedure including the risks, benefits and alternatives for the proposed anesthesia with the patient or authorized representative who has indicated his/her understanding and acceptance.     Dental advisory given  Plan Discussed with: CRNA  Anesthesia Plan Comments:        Anesthesia Quick Evaluation

## 2019-06-29 ENCOUNTER — Ambulatory Visit (HOSPITAL_BASED_OUTPATIENT_CLINIC_OR_DEPARTMENT_OTHER): Payer: 59 | Admitting: Physician Assistant

## 2019-06-29 ENCOUNTER — Ambulatory Visit (HOSPITAL_BASED_OUTPATIENT_CLINIC_OR_DEPARTMENT_OTHER)
Admission: RE | Admit: 2019-06-29 | Discharge: 2019-06-30 | Disposition: A | Discharge status: Home / Self Care | Payer: 59 | Attending: Obstetrics and Gynecology | Admitting: Obstetrics and Gynecology

## 2019-06-29 ENCOUNTER — Ambulatory Visit (HOSPITAL_BASED_OUTPATIENT_CLINIC_OR_DEPARTMENT_OTHER): Payer: 59 | Admitting: Anesthesiology

## 2019-06-29 ENCOUNTER — Encounter (HOSPITAL_BASED_OUTPATIENT_CLINIC_OR_DEPARTMENT_OTHER)
Admission: RE | Disposition: A | Discharge status: Home / Self Care | Payer: Self-pay | Source: Home / Self Care | Attending: Obstetrics and Gynecology

## 2019-06-29 ENCOUNTER — Encounter (HOSPITAL_BASED_OUTPATIENT_CLINIC_OR_DEPARTMENT_OTHER): Payer: Self-pay | Admitting: Obstetrics and Gynecology

## 2019-06-29 DIAGNOSIS — N946 Dysmenorrhea, unspecified: Secondary | ICD-10-CM | POA: Diagnosis not present

## 2019-06-29 DIAGNOSIS — F419 Anxiety disorder, unspecified: Secondary | ICD-10-CM | POA: Diagnosis not present

## 2019-06-29 DIAGNOSIS — Z90710 Acquired absence of both cervix and uterus: Secondary | ICD-10-CM | POA: Diagnosis present

## 2019-06-29 DIAGNOSIS — K219 Gastro-esophageal reflux disease without esophagitis: Secondary | ICD-10-CM | POA: Insufficient documentation

## 2019-06-29 DIAGNOSIS — I10 Essential (primary) hypertension: Secondary | ICD-10-CM | POA: Insufficient documentation

## 2019-06-29 DIAGNOSIS — N920 Excessive and frequent menstruation with regular cycle: Secondary | ICD-10-CM | POA: Insufficient documentation

## 2019-06-29 HISTORY — PX: CYSTOSCOPY: SHX5120

## 2019-06-29 HISTORY — PX: LAPAROSCOPIC VAGINAL HYSTERECTOMY WITH SALPINGECTOMY: SHX6680

## 2019-06-29 LAB — TYPE AND SCREEN
ABO/RH(D): O POS
Antibody Screen: NEGATIVE

## 2019-06-29 LAB — POCT PREGNANCY, URINE: Preg Test, Ur: NEGATIVE

## 2019-06-29 SURGERY — HYSTERECTOMY, VAGINAL, LAPAROSCOPY-ASSISTED, WITH SALPINGECTOMY
Anesthesia: General | Site: Bladder

## 2019-06-29 MED ORDER — SUGAMMADEX SODIUM 200 MG/2ML IV SOLN
INTRAVENOUS | Status: DC | PRN
  Administered 2019-06-29: 200 mg via INTRAVENOUS

## 2019-06-29 MED ORDER — ONDANSETRON HCL 4 MG/2ML IJ SOLN
INTRAMUSCULAR | Status: AC
  Filled 2019-06-29: qty 2

## 2019-06-29 MED ORDER — PROPOFOL 10 MG/ML IV BOLUS
INTRAVENOUS | Status: AC
  Filled 2019-06-29: qty 20

## 2019-06-29 MED ORDER — BUPIVACAINE HCL (PF) 0.25 % IJ SOLN
INTRAMUSCULAR | Status: DC | PRN
  Administered 2019-06-29: 8 mL

## 2019-06-29 MED ORDER — KETOROLAC TROMETHAMINE 30 MG/ML IJ SOLN
INTRAMUSCULAR | Status: DC | PRN
  Administered 2019-06-29: 30 mg via INTRAVENOUS

## 2019-06-29 MED ORDER — HYDROMORPHONE HCL 1 MG/ML IJ SOLN
INTRAMUSCULAR | Status: AC
  Filled 2019-06-29: qty 1

## 2019-06-29 MED ORDER — ROCURONIUM BROMIDE 10 MG/ML (PF) SYRINGE
PREFILLED_SYRINGE | INTRAVENOUS | Status: AC
  Filled 2019-06-29: qty 10

## 2019-06-29 MED ORDER — HYDROMORPHONE HCL 1 MG/ML IJ SOLN
0.2000 mg | INTRAMUSCULAR | Status: DC | PRN
  Filled 2019-06-29: qty 1

## 2019-06-29 MED ORDER — EPHEDRINE SULFATE 50 MG/ML IJ SOLN
INTRAMUSCULAR | Status: DC | PRN
  Administered 2019-06-29: 10 mg via INTRAVENOUS

## 2019-06-29 MED ORDER — HYDROMORPHONE HCL 1 MG/ML IJ SOLN
0.2500 mg | INTRAMUSCULAR | Status: DC | PRN
  Administered 2019-06-29: 0.25 mg via INTRAVENOUS
  Filled 2019-06-29: qty 0.5

## 2019-06-29 MED ORDER — ONDANSETRON HCL 4 MG PO TABS
4.0000 mg | ORAL_TABLET | Freq: Four times a day (QID) | ORAL | Status: DC | PRN
  Filled 2019-06-29: qty 1

## 2019-06-29 MED ORDER — DEXAMETHASONE SODIUM PHOSPHATE 10 MG/ML IJ SOLN
INTRAMUSCULAR | Status: DC | PRN
  Administered 2019-06-29: 10 mg via INTRAVENOUS

## 2019-06-29 MED ORDER — MIDAZOLAM HCL 2 MG/2ML IJ SOLN
INTRAMUSCULAR | Status: AC
  Filled 2019-06-29: qty 2

## 2019-06-29 MED ORDER — INDAPAMIDE 1.25 MG PO TABS
1.2500 mg | ORAL_TABLET | Freq: Every day | ORAL | Status: DC
  Filled 2019-06-29: qty 1

## 2019-06-29 MED ORDER — LACTATED RINGERS IV SOLN
INTRAVENOUS | Status: DC
  Filled 2019-06-29: qty 1000

## 2019-06-29 MED ORDER — ACETAMINOPHEN 325 MG PO TABS
ORAL_TABLET | ORAL | Status: AC
  Filled 2019-06-29: qty 2

## 2019-06-29 MED ORDER — VASOPRESSIN 20 UNIT/ML IV SOLN
INTRAVENOUS | Status: DC | PRN
  Administered 2019-06-29: 10 mL via INTRAMUSCULAR

## 2019-06-29 MED ORDER — FLUORESCEIN SODIUM 10 % IV SOLN
INTRAVENOUS | Status: AC
  Filled 2019-06-29: qty 5

## 2019-06-29 MED ORDER — PROPOFOL 10 MG/ML IV BOLUS
INTRAVENOUS | Status: DC | PRN
  Administered 2019-06-29: 25 ug/kg/min via INTRAVENOUS

## 2019-06-29 MED ORDER — KETAMINE HCL 10 MG/ML IJ SOLN
INTRAMUSCULAR | Status: AC
  Filled 2019-06-29: qty 1

## 2019-06-29 MED ORDER — ACETAMINOPHEN 500 MG PO TABS
ORAL_TABLET | ORAL | Status: AC
  Filled 2019-06-29: qty 2

## 2019-06-29 MED ORDER — ONDANSETRON HCL 4 MG/2ML IJ SOLN
4.0000 mg | Freq: Four times a day (QID) | INTRAMUSCULAR | Status: DC | PRN
  Administered 2019-06-29: 4 mg via INTRAVENOUS
  Filled 2019-06-29: qty 2

## 2019-06-29 MED ORDER — ONDANSETRON HCL 4 MG/2ML IJ SOLN
INTRAMUSCULAR | Status: DC | PRN
  Administered 2019-06-29 (×2): 4 mg via INTRAVENOUS

## 2019-06-29 MED ORDER — IBUPROFEN 800 MG PO TABS
800.0000 mg | ORAL_TABLET | Freq: Three times a day (TID) | ORAL | Status: DC
  Administered 2019-06-29 – 2019-06-30 (×2): 800 mg via ORAL
  Filled 2019-06-29: qty 1

## 2019-06-29 MED ORDER — HYDROMORPHONE HCL 1 MG/ML IJ SOLN
INTRAMUSCULAR | Status: DC | PRN
  Administered 2019-06-29: 1 mg via INTRAVENOUS
  Administered 2019-06-29: .5 mg via INTRAVENOUS

## 2019-06-29 MED ORDER — FENTANYL CITRATE (PF) 250 MCG/5ML IJ SOLN
INTRAMUSCULAR | Status: AC
  Filled 2019-06-29: qty 5

## 2019-06-29 MED ORDER — SERTRALINE HCL 100 MG PO TABS
100.0000 mg | ORAL_TABLET | Freq: Every day | ORAL | Status: DC
  Filled 2019-06-29: qty 1

## 2019-06-29 MED ORDER — OXYCODONE HCL 5 MG PO TABS
5.0000 mg | ORAL_TABLET | ORAL | Status: DC | PRN
  Filled 2019-06-29: qty 2

## 2019-06-29 MED ORDER — FENTANYL CITRATE (PF) 100 MCG/2ML IJ SOLN
INTRAMUSCULAR | Status: AC
  Filled 2019-06-29: qty 2

## 2019-06-29 MED ORDER — SODIUM CHLORIDE 0.9 % IR SOLN
Status: DC | PRN
  Administered 2019-06-29: 2000 mL

## 2019-06-29 MED ORDER — SIMETHICONE 80 MG PO CHEW
80.0000 mg | CHEWABLE_TABLET | Freq: Four times a day (QID) | ORAL | Status: DC | PRN
  Filled 2019-06-29: qty 1

## 2019-06-29 MED ORDER — SCOPOLAMINE 1 MG/3DAYS TD PT72
1.0000 | MEDICATED_PATCH | TRANSDERMAL | Status: DC
  Administered 2019-06-29: 07:00:00 1.5 mg via TRANSDERMAL
  Filled 2019-06-29: qty 1

## 2019-06-29 MED ORDER — ACETAMINOPHEN 500 MG PO TABS
1000.0000 mg | ORAL_TABLET | Freq: Once | ORAL | Status: AC
  Administered 2019-06-29: 1000 mg via ORAL
  Filled 2019-06-29: qty 2

## 2019-06-29 MED ORDER — IBUPROFEN 800 MG PO TABS
ORAL_TABLET | ORAL | Status: AC
  Filled 2019-06-29: qty 1

## 2019-06-29 MED ORDER — METOPROLOL TARTRATE 5 MG/5ML IV SOLN
2.5000 mg | INTRAVENOUS | Status: AC | PRN
  Administered 2019-06-29 (×2): 2.5 mg via INTRAVENOUS
  Filled 2019-06-29: qty 5

## 2019-06-29 MED ORDER — HYDROMORPHONE HCL 2 MG/ML IJ SOLN
INTRAMUSCULAR | Status: AC
  Filled 2019-06-29: qty 1

## 2019-06-29 MED ORDER — PROMETHAZINE HCL 25 MG/ML IJ SOLN
6.2500 mg | INTRAMUSCULAR | Status: DC | PRN
  Filled 2019-06-29: qty 1

## 2019-06-29 MED ORDER — KETOROLAC TROMETHAMINE 30 MG/ML IJ SOLN
30.0000 mg | Freq: Once | INTRAMUSCULAR | Status: AC | PRN
  Filled 2019-06-29: qty 1

## 2019-06-29 MED ORDER — CEFAZOLIN SODIUM-DEXTROSE 2-4 GM/100ML-% IV SOLN
INTRAVENOUS | Status: AC
  Filled 2019-06-29: qty 100

## 2019-06-29 MED ORDER — LIDOCAINE 2% (20 MG/ML) 5 ML SYRINGE
INTRAMUSCULAR | Status: AC
  Filled 2019-06-29: qty 5

## 2019-06-29 MED ORDER — ROCURONIUM BROMIDE 100 MG/10ML IV SOLN
INTRAVENOUS | Status: DC | PRN
  Administered 2019-06-29: 15 mg via INTRAVENOUS
  Administered 2019-06-29: 20 mg via INTRAVENOUS
  Administered 2019-06-29: 60 mg via INTRAVENOUS
  Administered 2019-06-29: 10 mg via INTRAVENOUS

## 2019-06-29 MED ORDER — SCOPOLAMINE 1 MG/3DAYS TD PT72
MEDICATED_PATCH | TRANSDERMAL | Status: AC
  Filled 2019-06-29: qty 1

## 2019-06-29 MED ORDER — ACETAMINOPHEN 325 MG PO TABS
650.0000 mg | ORAL_TABLET | ORAL | Status: DC | PRN
  Administered 2019-06-29 – 2019-06-30 (×4): 650 mg via ORAL
  Filled 2019-06-29: qty 2

## 2019-06-29 MED ORDER — MIDAZOLAM HCL 5 MG/5ML IJ SOLN
INTRAMUSCULAR | Status: DC | PRN
  Administered 2019-06-29: 2 mg via INTRAVENOUS

## 2019-06-29 MED ORDER — PROPOFOL 500 MG/50ML IV EMUL
INTRAVENOUS | Status: DC | PRN
  Administered 2019-06-29: 160 mg via INTRAVENOUS

## 2019-06-29 MED ORDER — PROPOFOL 500 MG/50ML IV EMUL
INTRAVENOUS | Status: AC
  Filled 2019-06-29: qty 50

## 2019-06-29 MED ORDER — ESMOLOL HCL 100 MG/10ML IV SOLN
INTRAVENOUS | Status: DC | PRN
  Administered 2019-06-29: 20 mg via INTRAVENOUS

## 2019-06-29 MED ORDER — FLUORESCEIN SODIUM 10 % IV SOLN
INTRAVENOUS | Status: DC | PRN
  Administered 2019-06-29: 5 mL via INTRAVENOUS

## 2019-06-29 MED ORDER — METOPROLOL TARTRATE 5 MG/5ML IV SOLN
INTRAVENOUS | Status: AC
  Filled 2019-06-29: qty 5

## 2019-06-29 MED ORDER — FENTANYL CITRATE (PF) 100 MCG/2ML IJ SOLN
INTRAMUSCULAR | Status: DC | PRN
  Administered 2019-06-29 (×4): 25 ug via INTRAVENOUS
  Administered 2019-06-29: 100 ug via INTRAVENOUS
  Administered 2019-06-29 (×3): 50 ug via INTRAVENOUS

## 2019-06-29 MED ORDER — KETAMINE HCL 10 MG/ML IJ SOLN
INTRAMUSCULAR | Status: DC | PRN
  Administered 2019-06-29: 35 mg via INTRAVENOUS

## 2019-06-29 MED ORDER — PANTOPRAZOLE SODIUM 40 MG PO TBEC
40.0000 mg | DELAYED_RELEASE_TABLET | Freq: Every day | ORAL | Status: DC
  Administered 2019-06-29: 17:00:00 40 mg via ORAL
  Filled 2019-06-29: qty 1

## 2019-06-29 MED ORDER — CEFAZOLIN SODIUM-DEXTROSE 2-4 GM/100ML-% IV SOLN
2.0000 g | INTRAVENOUS | Status: AC
  Administered 2019-06-29: 2 g via INTRAVENOUS
  Filled 2019-06-29: qty 100

## 2019-06-29 SURGICAL SUPPLY — 41 items
ADH SKN CLS APL DERMABOND .7 (GAUZE/BANDAGES/DRESSINGS) ×2
APL SRG 38 LTWT LNG FL B (MISCELLANEOUS)
APPLICATOR ARISTA FLEXITIP XL (MISCELLANEOUS) IMPLANT
CABLE HIGH FREQUENCY MONO STRZ (ELECTRODE) IMPLANT
COVER BACK TABLE 60X90IN (DRAPES) ×4 IMPLANT
COVER MAYO STAND STRL (DRAPES) ×4 IMPLANT
COVER WAND RF STERILE (DRAPES) ×4 IMPLANT
DERMABOND ADVANCED (GAUZE/BANDAGES/DRESSINGS) ×2
DERMABOND ADVANCED .7 DNX12 (GAUZE/BANDAGES/DRESSINGS) ×2 IMPLANT
DRSG COVADERM PLUS 2X2 (GAUZE/BANDAGES/DRESSINGS) ×2 IMPLANT
DRSG OPSITE POSTOP 3X4 (GAUZE/BANDAGES/DRESSINGS) ×4 IMPLANT
DURAPREP 26ML APPLICATOR (WOUND CARE) ×4 IMPLANT
ELECT REM PT RETURN 9FT ADLT (ELECTROSURGICAL) ×4
ELECTRODE REM PT RTRN 9FT ADLT (ELECTROSURGICAL) IMPLANT
FILTER SMOKE EVAC LAPAROSHD (FILTER) IMPLANT
GAUZE 4X4 16PLY RFD (DISPOSABLE) ×4 IMPLANT
GLOVE BIO SURGEON STRL SZ7 (GLOVE) ×14 IMPLANT
GLOVE BIOGEL PI IND STRL 7.0 (GLOVE) ×2 IMPLANT
GLOVE BIOGEL PI INDICATOR 7.0 (GLOVE) ×4
GLOVE ECLIPSE 6.5 STRL STRAW (GLOVE) ×4 IMPLANT
HEMOSTAT ARISTA ABSORB 3G PWDR (HEMOSTASIS) IMPLANT
NEEDLE INSUFFLATION 120MM (ENDOMECHANICALS) ×4 IMPLANT
NS IRRIG 500ML POUR BTL (IV SOLUTION) ×4 IMPLANT
PACK LAVH (CUSTOM PROCEDURE TRAY) ×4 IMPLANT
PACK ROBOTIC GOWN (GOWN DISPOSABLE) ×4 IMPLANT
PACK TRENDGUARD 450 HYBRID PRO (MISCELLANEOUS) IMPLANT
PROTECTOR NERVE ULNAR (MISCELLANEOUS) ×4 IMPLANT
SET IRRIG TUBING LAPAROSCOPIC (IRRIGATION / IRRIGATOR) ×2 IMPLANT
SET IRRIG Y TYPE TUR BLADDER L (SET/KITS/TRAYS/PACK) ×4 IMPLANT
SET TUBE SMOKE EVAC HIGH FLOW (TUBING) ×4 IMPLANT
SHEARS HARMONIC ACE PLUS 36CM (ENDOMECHANICALS) ×4 IMPLANT
SUT VIC AB 0 CT1 18XCR BRD8 (SUTURE) ×4 IMPLANT
SUT VIC AB 0 CT1 8-18 (SUTURE) ×8
SUT VIC AB 2-0 CT1 (SUTURE) ×8 IMPLANT
SUT VICRYL 0 TIES 12 18 (SUTURE) IMPLANT
SUT VICRYL 4-0 PS2 18IN ABS (SUTURE) ×4 IMPLANT
TOWEL OR 17X26 10 PK STRL BLUE (TOWEL DISPOSABLE) ×6 IMPLANT
TRAY FOLEY W/BAG SLVR 14FR (SET/KITS/TRAYS/PACK) ×4 IMPLANT
TRENDGUARD 450 HYBRID PRO PACK (MISCELLANEOUS) ×4
TROCAR BLADELESS OPT 5 100 (ENDOMECHANICALS) ×12 IMPLANT
WARMER LAPAROSCOPE (MISCELLANEOUS) ×4 IMPLANT

## 2019-06-29 NOTE — Transfer of Care (Signed)
Immediate Anesthesia Transfer of Care Note  Patient: Tiosha Huss  Procedure(s) Performed: LAPAROSCOPIC ASSISTED VAGINAL HYSTERECTOMY WITH SALPINGECTOMY (Bilateral Abdomen) CYSTOSCOPY (N/A Bladder)  Patient Location: PACU  Anesthesia Type:General  Level of Consciousness: awake, drowsy and patient cooperative  Airway & Oxygen Therapy: Patient Spontanous Breathing and Patient connected to face mask oxygen  Post-op Assessment: Report given to RN and Post -op Vital signs reviewed and stable  Post vital signs: Reviewed and stable  Last Vitals:  Vitals Value Taken Time  BP 144/73 06/29/19 1150  Temp 36.8 C 06/29/19 1150  Pulse 115 06/29/19 1155  Resp 13 06/29/19 1155  SpO2 100 % 06/29/19 1155  Vitals shown include unvalidated device data.  Last Pain:  Vitals:   06/29/19 0720  TempSrc: Oral  PainSc: 0-No pain      Patients Stated Pain Goal: 5 ([id number])  Complications: No apparent anesthesia complications

## 2019-06-29 NOTE — Anesthesia Procedure Notes (Signed)
Procedure Name: Intubation Date/Time: 06/29/2019 9:02 AM Performed by: Garrel Ridgel, CRNA Pre-anesthesia Checklist: Patient identified, Emergency Drugs available, Suction available and Patient being monitored Patient Re-evaluated:Patient Re-evaluated prior to induction Oxygen Delivery Method: Circle system utilized Preoxygenation: Pre-oxygenation with 100% oxygen Induction Type: IV induction Ventilation: Mask ventilation without difficulty Tube type: Oral Tube size: 7.0 mm Number of attempts: 1 Airway Equipment and Method: Stylet and Oral airway Placement Confirmation: ETT inserted through vocal cords under direct vision,  positive ETCO2 and breath sounds checked- equal and bilateral Secured at: 21 cm Tube secured with: Tape Dental Injury: Teeth and Oropharynx as per pre-operative assessment

## 2019-06-29 NOTE — Op Note (Addendum)
Operative Note  The assistance of Dr. Wilhelmenia Blase was necessary for exposure and to complete this surgery.  Preoperative Diagnosis Dysmenorrhea Menorrhagia  Postoperative Diagnosis same  Procedure Laparoscopic assisted vaginal hysterectomy and bilateral salpingectomies Cystoscopy  Surgeon Paula Compton, MD Eula Flax, MD  Anesthesia GETA  Fluids: EBL 244mL UOP 120mL  IVF 1200LR  Findings Normal size uterus, ovaries and tubes WNL, shallow posterior cul-de-sac  Specimen Uterus, cervix, tubes  Procedure Note Patient was taken to the operating room where general anesthesia was obtained without difficulty. She was then prepped and draped in the normal sterile fashion in the dorsal lithotomy position. An appropriate timeout was performed. A speculum was then placed within the vagina and a Hulka tenaculum placed within the cervix for uterine manipulation. A foley catheter was placed in the bladder.  Attention was then turned to the patient's abdomen after draping where the infraumbilical area was injected with approximately 10 cc of quarter percent Marcaine. A 1 cm incision was then made within the umbilicus and the varies needle easily introduced into the peritoneal cavity. Intraperitoneal placement was confirmed by aspiration and injection with normal saline. Gas flow was then applied and a pneumoperitoneum obtained with approximate 3 L of CO2 gas. The varies needle was then removed and a 5 mm optiview trocar was easily introduced into the abdomen under direct visualization. . With patient in Trendelenburg the uterus and tubes and ovaries were inspected with findings as previously stated. Two additional trocars were placed in the upper lateral quadrants under direct visualization after injection with quarter percent marcaine.  THe Harmonic scalpel was then utilized to dissect the fallopian tubes from the mesosalpinx bilaterally down to the level of the cornua.  The remainder of the  uteroovarian ligament and the round ligament were then also taken down with the Harmonic to the level of the bladder flap.  The bladder flap was taken down from the lower uterine segment and pushed away to expose the cervix.  The uterine arteries were taken down bilaterally with the Harmonic scalpel.  All pedicles were hemostatic. Attention was then turned to the vagina after all instruments were removed and the trocars covered with a sterile drape. The cervix was grasped with Yates Decamp tenaculums x 2 and injected with a dilute solution of Pitressin circumferentially.  The bovie was then used to make a circumferential incision.  The mayo scissors then further dissected the vaginal mucosa from the underlying cervix and the anterior and posterior cul de sac entered sharply.  With a banana speculum and deaver retractor isolating the uterus from the bladder and rectum.  The uterosacral ligaments and paracervical tissue was taken down sequentially with parametrial clamps and suture ligated with zero vicryl at each step.  The posterior cul-de-sac was shallow, so care was taken to enter close to the uterus.   When  the was uterus freed, it was then delivered.     It was handed off to pathology.   A small amount of bleeding on the right was controlled with several figure of eight sutures of zero vicryl and all was hemostatic. The uterosacral ligaments were approximated with zero vicryl.  The short weighted speculum was placed and the cuff run with a running locked 2-0 vicryl for hemostasis. All instruments were then removed from the vagina.  A cystoscopy was then performed and the ureters identified with normal jets noted bilaterally, there were no stitches in the bladder.    Gowns and gloves were changed and attention was returned to the  abdomen, where pneumoperitoneum was again obtained and all inspected.  The cuff and pedicles were hemostatic. A four quadrant view of the pelvis and abdomen was performed and found to be  normal with no bleeding or injuries noted.  The instruments were removed from the abdomen as well as the 5 mm lateral ports under visualization.  The pneumoperitoneum was reduced through the trocar. The trocar was finally removed and the infraumbilical incision and lateral incisions were closed with a subcuticular stitch of 3-0 Vicryl. Dermabond and a bandage were placed. Patient was then awakened and taken to the recovery room in good condition.

## 2019-06-29 NOTE — Progress Notes (Signed)
Dr. Marvel Plan in to evaluate pt.  No new orders received. Pt c/o slight itching earlier but relieved presently.  Tolerating small amts of po fluids and food.  Occasional c/o nausea. Having difficulty keeping eyes open.  Will continue to monitor. VSS

## 2019-06-29 NOTE — Anesthesia Postprocedure Evaluation (Signed)
Anesthesia Post Note  Patient: Dawn Meadows  Procedure(s) Performed: LAPAROSCOPIC ASSISTED VAGINAL HYSTERECTOMY WITH SALPINGECTOMY (Bilateral Abdomen) CYSTOSCOPY (N/A Bladder)     Patient location during evaluation: PACU Anesthesia Type: General Level of consciousness: awake and alert Pain management: pain level controlled Vital Signs Assessment: post-procedure vital signs reviewed and stable Respiratory status: spontaneous breathing, nonlabored ventilation, respiratory function stable and patient connected to nasal cannula oxygen Cardiovascular status: blood pressure returned to baseline, stable and tachycardic Postop Assessment: no apparent nausea or vomiting Anesthetic complications: no Comments: Post-operative tachycardia treated in PACU.     Last Vitals:  Vitals:   06/29/19 1331 06/29/19 1422  BP: (!) 134/91 127/85  Pulse: (!) 101   Resp: 14   Temp: 36.8 C 36.7 C  SpO2: 95% 96%    Last Pain:  Vitals:   06/29/19 1422  TempSrc:   PainSc: 2                  Nakia Koble P Loys Hoselton

## 2019-06-29 NOTE — Progress Notes (Signed)
Patient ID: Dawn Meadows, female   DOB: 1986-01-29, 34 y.o.   MRN: [id number] Per pt no changes in dictated H&P and brief exam WNL.  Ready to proceed.

## 2019-06-29 NOTE — Discharge Summary (Signed)
Physician Discharge Summary  Patient ID: Dawn Meadows MRN: [id number] DOB/AGE: 11-Jun-1985 34 y.o.  Admit date: 06/29/2019 Discharge date: 06/30/2019  Admission Diagnoses: Menorrhagia  Dysmenorrhea  Discharge Diagnoses:  Same  Active Problems:   S/P laparoscopic assisted vaginal hysterectomy (LAVH) [name] salpingectomies/cystoscopy  Discharged Condition: good  Hospital Course: Pt was in extended observation post-surgery and did well.  By POD #1  She was ambulating, tolerating po, and voiding without problem.  Pain was controlled with ibuprofen and tylenol.     Significant Diagnostic Studies: labs: CBC BMP  Treatments: surgery: LAVH/BS  Discharge Exam: Blood pressure 117/76, pulse 67, temperature 98.2 F (36.8 C), resp. rate 16, height 5\' 6"  (1.676 m), weight 70.6 kg, last menstrual period 06/21/2019, SpO2 100 %. General appearance: alert and cooperative GI: soft NT Incisions clear  Disposition: Discharge disposition: 01-Home or Self Care       Discharge Instructions    Call MD for:  persistant nausea and vomiting   Complete by: As directed    Call MD for:  redness, tenderness, or signs of infection (pain, swelling, redness, odor or green/yellow discharge around incision site)   Complete by: As directed    Call MD for:  severe uncontrolled pain   Complete by: As directed    Call MD for:  temperature >100.4   Complete by: As directed    Diet - low sodium heart healthy   Complete by: As directed    Discharge instructions   Complete by: As directed    Avoid driving for at least 1-2 days or until off narcotic pain meds.  No heavy lifting greater than 10 lbs.  Nothing in vagina for 6 weeks.   Shower over incisions and pat dry.     Allergies as of 06/30/2019      Reactions   Codeine Nausea And Vomiting   Procardia [nifedipine] Rash      Medication List    TAKE these medications   acetaminophen 325 MG tablet Commonly known as: TYLENOL Take 2 tablets (650  mg total) by mouth every 4 (four) hours as needed for mild pain (temperature > 101.5.).   diclofenac Sodium 1 % Gel Commonly known as: VOLTAREN Apply 2 g topically 4 (four) times daily. Rub into affected area of foot 2 to 4 times daily   ibuprofen 800 MG tablet Commonly known as: ADVIL Take 1 tablet (800 mg total) by mouth every 8 (eight) hours.   indapamide 1.25 MG tablet Commonly known as: LOZOL Take 1.25 mg by mouth daily.   pantoprazole 40 MG tablet Commonly known as: PROTONIX Take 40 mg by mouth daily.   sertraline 100 MG tablet Commonly known as: ZOLOFT Take 100 mg by mouth daily.      Follow-up Information    Paula Compton, MD. Schedule an appointment as soon as possible for a visit in 2 week(s).   Specialty: Obstetrics and Gynecology Why: Incision check Contact information: [address] [place] [address] [phone]           Signed: Logan Bores 06/30/2019, 8:22 AM

## 2019-06-29 NOTE — Progress Notes (Signed)
Day of Surgery Procedure(s) (LRB): LAPAROSCOPIC ASSISTED VAGINAL HYSTERECTOMY WITH SALPINGECTOMY (Bilateral) CYSTOSCOPY (N/A)  Subjective: Patient reports tolerating PO.   Has some mild nausea but no emesis.  Pain minimal, cramping.     Objective: I have reviewed patient's vital signs and intake and output. UOP good >460ml in bag  General: alert and cooperative GI: soft NT incisions clear  Assessment: s/p Procedure(s): LAPAROSCOPIC ASSISTED VAGINAL HYSTERECTOMY WITH SALPINGECTOMY (Bilateral) CYSTOSCOPY (N/A): progressing well  Plan: Advance diet Advance to PO medication Discontinue IV fluids this PM when drinking more Ambulate and if does well will pull foley  LOS: 0 days    Dawn Meadows 06/29/2019, 5:17 PM

## 2019-06-30 DIAGNOSIS — N946 Dysmenorrhea, unspecified: Secondary | ICD-10-CM | POA: Diagnosis not present

## 2019-06-30 LAB — BASIC METABOLIC PANEL
Anion gap: 10 (ref 5–15)
BUN: 13 mg/dL (ref 6–20)
CO2: 25 mmol/L (ref 22–32)
Calcium: 8.8 mg/dL — ABNORMAL LOW (ref 8.9–10.3)
Chloride: 103 mmol/L (ref 98–111)
Creatinine, Ser: 0.92 mg/dL (ref 0.44–1.00)
GFR calc Af Amer: >60 mL/min (ref >60)
GFR calc non Af Amer: >60 mL/min (ref >60)
Glucose, Bld: 124 mg/dL — ABNORMAL HIGH (ref 70–99)
Potassium: 3.6 mmol/L (ref 3.5–5.1)
Sodium: 138 mmol/L (ref 135–145)

## 2019-06-30 LAB — CBC
HCT: 35.7 % — ABNORMAL LOW (ref 36.0–46.0)
Hemoglobin: 11.6 g/dL — ABNORMAL LOW (ref 12.0–15.0)
MCH: 30.1 pg (ref 26.0–34.0)
MCHC: 32.5 g/dL (ref 30.0–36.0)
MCV: 92.7 fL (ref 80.0–100.0)
Platelets: 270 10*3/uL (ref 150–400)
RBC: 3.85 MIL/uL — ABNORMAL LOW (ref 3.87–5.11)
RDW: 12.6 % (ref 11.5–15.5)
WBC: 11.9 10*3/uL — ABNORMAL HIGH (ref 4.0–10.5)
nRBC: 0 % (ref 0.0–0.2)

## 2019-06-30 LAB — SURGICAL PATHOLOGY

## 2019-06-30 MED ORDER — ACETAMINOPHEN 325 MG PO TABS
ORAL_TABLET | ORAL | Status: AC
  Filled 2019-06-30: qty 2

## 2019-06-30 MED ORDER — ACETAMINOPHEN 325 MG PO TABS: 650.0000 mg | ORAL_TABLET | ORAL | 1 refills | Status: AC | PRN

## 2019-06-30 MED ORDER — IBUPROFEN 800 MG PO TABS: 800.0000 mg | ORAL_TABLET | Freq: Three times a day (TID) | ORAL | 0 refills | Status: AC

## 2019-06-30 MED ORDER — IBUPROFEN 800 MG PO TABS
ORAL_TABLET | ORAL | Status: AC
  Filled 2019-06-30: qty 1

## 2019-06-30 NOTE — Discharge Instructions (Signed)
Laparoscopically Assisted Vaginal Hysterectomy, Care After This sheet gives you information about how to care for yourself after your procedure. Your health care provider may also give you more specific instructions. If you have problems or questions, contact your health care provider. What can I expect after the procedure? After the procedure, it is common to have:  Soreness and numbness in your incision areas.  Abdominal pain. You will be given pain medicine to control it.  Vaginal bleeding and discharge. You will need to use a sanitary napkin after this procedure.  Sore throat from the breathing tube that was inserted during surgery. Follow these instructions at home: Medicines  Take over-the-counter and prescription medicines only as told by your health care provider.  Do not take aspirin or ibuprofen. These medicines can cause bleeding.  Do not drive or use heavy machinery while taking prescription pain medicine.  Do not drive for 24 hours if you were given a medicine to help you relax (sedative) during the procedure. Incision care   Follow instructions from your health care provider about how to take care of your incisions. Make sure you: ? Wash your hands with soap and water before you change your bandage (dressing). If soap and water are not available, use hand sanitizer. ? Change your dressing as told by your health care provider. ? Leave stitches (sutures), skin glue, or adhesive strips in place. These skin closures may need to stay in place for 2 weeks or longer. If adhesive strip edges start to loosen and curl up, you may trim the loose edges. Do not remove adhesive strips completely unless your health care provider tells you to do that.  Check your incision area every day for signs of infection. Check for: ? Redness, swelling, or pain. ? Fluid or blood. ? Warmth. ? Pus or a bad smell. Activity  Get regular exercise as told by your health care provider. You may be  told to take short walks every day and go farther each time.  Return to your normal activities as told by your health care provider. Ask your health care provider what activities are safe for you.  Do not douche, use tampons, or have sexual intercourse for at least 6 weeks, or until your health care provider gives you permission.  Do not lift anything that is heavier than 10 lb (4.5 kg), or the limit that your health care provider tells you, until he or she says that it is safe. General instructions  Do not take baths, swim, or use a hot tub until your health care provider approves. Take showers instead of baths.  Do not drive for 24 hours if you received a sedative.  Do not drive or operate heavy machinery while taking prescription pain medicine.  To prevent or treat constipation while you are taking prescription pain medicine, your health care provider may recommend that you: ? Drink enough fluid to keep your urine clear or pale yellow. ? Take over-the-counter or prescription medicines. ? Eat foods that are high in fiber, such as fresh fruits and vegetables, whole grains, and beans. ? Limit foods that are high in fat and processed sugars, such as fried and sweet foods.  Keep all follow-up visits as told by your health care provider. This is important. Contact a health care provider if:  You have signs of infection, such as: ? Redness, swelling, or pain around your incision sites. ? Fluid or blood coming from an incision. ? An incision that feels warm to the   touch. ? Pus or a bad smell coming from an incision.  Your incision breaks open.  Your pain medicine is not helping.  You feel dizzy or light-headed.  You have pain or bleeding when you urinate.  You have persistent nausea and vomiting.  You have blood, pus, or a bad-smelling discharge from your vagina. Get help right away if:  You have a fever.  You have severe abdominal pain.  You have chest pain.  You have  shortness of breath.  You faint.  You have pain, swelling, or redness in your leg.  You have heavy bleeding from your vagina. Summary  After the procedure, it is common to have abdominal pain and vaginal bleeding.  You should not drive or lift heavy objects until your health care provider says that it is safe.  Contact your health care provider if you have any symptoms of infection, excessive vaginal bleeding, nausea, vomiting, or shortness of breath. This information is not intended to replace advice given to you by your health care provider. Make sure you discuss any questions you have with your health care provider. Document Revised: 04/09/2017 Document Reviewed: 06/23/2016 Elsevier Patient Education  2020 [employer]  

## 2019-06-30 NOTE — Progress Notes (Signed)
1 Day Post-Op Procedure(s) (LRB): LAPAROSCOPIC ASSISTED VAGINAL HYSTERECTOMY WITH SALPINGECTOMY (Bilateral) CYSTOSCOPY (N/A)  Subjective: Patient reports tolerating PO.  She has no further N/V and states she has no pain She had some frequency last PM, but has been able to empty her bladder  Objective: I have reviewed patient's vital signs, intake and output and labs.  General: alert and cooperative GI: incision: clean, dry and intact and soft NT Vaginal Bleeding: minimal  Assessment: s/p Procedure(s): LAPAROSCOPIC ASSISTED VAGINAL HYSTERECTOMY WITH SALPINGECTOMY (Bilateral) CYSTOSCOPY (N/A): progressing well  Plan: Discharge home  D/w pt frequency and she will moniotr, unlikely to have bladder infection this soon post-op. Likely just irritation from foley catheter  LOS: 0 days    Dawn Meadows 06/30/2019, 8:18 AM

## 2019-07-14 ENCOUNTER — Ambulatory Visit: Payer: 59 | Admitting: Plastic Surgery

## 2020-04-17 ENCOUNTER — Institutional Professional Consult (permissible substitution): Payer: 59 | Admitting: Plastic Surgery

## 2020-04-18 ENCOUNTER — Other Ambulatory Visit: Payer: Self-pay

## 2020-04-18 ENCOUNTER — Ambulatory Visit: Payer: 59 | Admitting: Plastic Surgery

## 2020-04-18 ENCOUNTER — Encounter: Payer: Self-pay | Admitting: Plastic Surgery

## 2020-04-18 VITALS — BP 113/81 | HR 77 | Temp 97.9°F | Ht 66.0 in | Wt 156.8 lb

## 2020-04-18 DIAGNOSIS — L989 Disorder of the skin and subcutaneous tissue, unspecified: Secondary | ICD-10-CM | POA: Diagnosis not present

## 2020-04-18 NOTE — Progress Notes (Signed)
Referring Provider Nicholes Rough, PA-C [place],  [place] [address]   CC:  Chief Complaint  Patient presents with  . Advice Only      Dawn Meadows is an 34 y.o. female.  HPI: Patient presents to discuss several spots on her face and left leg.  She is concerned about a small spot in her right nasolabial fold.  It gets red and irritated and tends to get more red for period of time and then fade and then come back.  It has never been biopsied and has not bled but it is occasionally painful feeling like a small ulceration.  She also has a spot in her left upper thigh that started as a small red area and has grown in size over the last 3 to 4 months.  The lesion in her nasolabial fold has been there for about a year.  She has tried moisturizing it but has not made much of a difference.  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  Allergies  Allergen Reactions  . Codeine Nausea And Vomiting  . Procardia [Nifedipine] Rash    Outpatient Encounter Medications as of 04/18/2020  Medication Sig  . acetaminophen (TYLENOL) 325 MG tablet Take 2 tablets (650 mg total) by mouth every 4 (four) hours as needed for mild pain (temperature > 101.5.).  Marland Kitchen diclofenac Sodium (VOLTAREN) 1 % GEL Apply 2 g topically 4 (four) times daily. Rub into affected area of foot 2 to 4 times daily  . ibuprofen (ADVIL) 800 MG tablet Take 1 tablet (800 mg total) by mouth every 8 (eight) hours.  . indapamide (LOZOL) 1.25 MG tablet Take 1.25 mg by mouth daily.   . pantoprazole (PROTONIX) 40 MG tablet Take 40 mg by mouth daily.  . sertraline (ZOLOFT) 100 MG tablet Take 100 mg by mouth daily.    No facility-administered encounter medications on file as of 04/18/2020.     Past Medical History:  Diagnosis Date  . Anxiety   . Dermatitis   . Essential hypertension    MONITORED BY PCP  . GERD (gastroesophageal reflux disease)    prilosec prn   . Headache(784.0)   . Hematuria   . History of gestational hypertension    PT HAS HYPERTENSION BUT EXACERBATION DURING PREGNANCY'S  . History of kidney stones    2012 at 38 wks per pt thinks passed spontaneous  . History of palpitations   . History of panic attacks   . PONV (postoperative nausea and vomiting)    per pt"has extreme anxiety about throwing up"  . Postpartum hypertension MONITORED BY OB/GYN- DR Marvel Plan   RECENT SVD 07-21-2016 -- DX POSTPARTUM PRE-CLAMPSIA PPD 4  . SVD (spontaneous vaginal delivery)    07-21-2016    Past Surgical History:  Procedure Laterality Date  . CHOLECYSTECTOMY N/A 01/03/2019   Procedure: LAPAROSCOPIC CHOLECYSTECTOMY;  Surgeon: Rolm Bookbinder, MD;  Location: [facility];  Service: General;  Laterality: N/A;  . CYSTOSCOPY N/A 06/29/2019   Procedure: Consuela Mimes;  Surgeon: Paula Compton, MD;  Location: [facility];  Service: Gynecology;  Laterality: N/A;  . CYSTOSCOPY WITH RETROGRADE PYELOGRAM, URETEROSCOPY AND STENT PLACEMENT Bilateral 10/09/2016   Procedure: CYSTOSCOPY WITH RETROGRADE PYELOGRAM, URETEROSCOPY, STONE BASKETRY  AND STENT PLACEMENT;  Surgeon: Alexis Frock, MD;  Location: [facility];  Service: Urology;  Laterality: Bilateral;  . HOLMIUM LASER APPLICATION Bilateral 08/12/9673  Procedure: HOLMIUM LASER APPLICATION;  Surgeon: Alexis Frock, MD;  Location: [facility];  Service: Urology;  Laterality: Bilateral;  . LAPAROSCOPIC VAGINAL HYSTERECTOMY WITH SALPINGECTOMY Bilateral 06/29/2019   Procedure: LAPAROSCOPIC ASSISTED VAGINAL HYSTERECTOMY WITH SALPINGECTOMY;  Surgeon: Paula Compton, MD;  Location: [facility];  Service: Gynecology;  Laterality: Bilateral;  . TRANSTHORACIC ECHOCARDIOGRAM  07/11/2013   EF 55-60%/ trivial MR and PR  . WISDOM TOOTH EXTRACTION  2001    Family History  Problem Relation Age of Onset  . Breast cancer Maternal Grandmother    . Lung cancer Maternal Grandmother   . Hypotension Neg Hx   . Malignant hyperthermia Neg Hx   . Pseudochol deficiency Neg Hx   . Anesthesia problems Neg Hx     Social History   Social History Narrative  . Not on file     Review of Systems General: Denies fevers, chills, weight loss CV: Denies chest pain, shortness of breath, palpitations  Physical Exam Vitals with BMI 04/18/2020 06/30/2019 06/30/2019  Height 5\' 6"  - -  Weight 156 lbs 13 oz - -  BMI 29.47 - -  Systolic 654 650 354  Diastolic 81 92 76  Pulse 77 74 67    General:  No acute distress,  Alert and oriented, Non-Toxic, Normal speech and affect Examination shows a 2 to 3 mm diameter ulcerative lesion in the right nasolabial fold.  There is no surrounding skin changes otherwise.  She has a few sun spots around her cheeks bilaterally.  In the left upper thigh there is a half a centimeter diameter erythematous papule that looks like a small cystic lesion.  Assessment/Plan Patient presents with 2 spots that are somewhat concerning and certainly symptomatic for her.  I recommended punch excision of the right nasolabial fold and the direct excision of the left upper thigh lesion.  I think the sun spots around her cheeks could be treated with IPL laser.  We discussed the risk of the procedure that include bleeding, infection, damage to surrounding structures and need for additional procedures.  We discussed the location and orientation of the scars.  All of her questions were answered and we agreed to move forward.  [name] 04/18/2020, 11:15 AM

## 2020-05-23 ENCOUNTER — Ambulatory Visit: Payer: 59 | Admitting: Plastic Surgery

## 2020-07-17 ENCOUNTER — Ambulatory Visit: Payer: 59 | Admitting: Plastic Surgery

## 2020-07-17 ENCOUNTER — Other Ambulatory Visit (HOSPITAL_COMMUNITY)
Admission: RE | Admit: 2020-07-17 | Discharge: 2020-07-17 | Disposition: A | Discharge status: Home / Self Care | Payer: 59 | Source: Ambulatory Visit | Attending: Plastic Surgery | Admitting: Plastic Surgery

## 2020-07-17 ENCOUNTER — Encounter: Payer: Self-pay | Admitting: Plastic Surgery

## 2020-07-17 ENCOUNTER — Other Ambulatory Visit: Payer: Self-pay

## 2020-07-17 VITALS — BP 112/80 | HR 93

## 2020-07-17 DIAGNOSIS — L989 Disorder of the skin and subcutaneous tissue, unspecified: Secondary | ICD-10-CM | POA: Diagnosis not present

## 2020-07-17 NOTE — Progress Notes (Signed)
Operative Note   DATE OF OPERATION: 07/17/2020  LOCATION:    SURGICAL DEPARTMENT: Plastic Surgery  PREOPERATIVE DIAGNOSES: 1.  Right nasolabial fold skin lesion 2.  Left upper thigh skin lesion  POSTOPERATIVE DIAGNOSES:  same  PROCEDURE:  1. Excision of left upper thigh skin lesion measuring 2.5 cm 2. Complex closure measuring 2.5 cm 3. Punch excision of right nasolabial fold totaling 6 mm in length with simple closure  SURGEON: Talmadge Coventry, MD  ANESTHESIA:  Local  COMPLICATIONS: None.   INDICATIONS FOR PROCEDURE:  The patient, Dawn Meadows is a 35 y.o. female born on July 02, 1985, is here for treatment of symptomatic skin lesions MRN: [id number]  CONSENT:  Informed consent was obtained directly from the patient. Risks, benefits and alternatives were fully discussed. Specific risks including but not limited to bleeding, infection, hematoma, seroma, scarring, pain, infection, wound healing problems, and need for further surgery were all discussed. The patient did have an ample opportunity to have questions answered to satisfaction.   DESCRIPTION OF PROCEDURE:  Local anesthesia was administered. The patient's operative site was prepped and draped in a sterile fashion. A time out was performed and all information was confirmed to be correct.  The lesion was excised with a 15 blade.  Hemostasis was obtained.  Circumferential undermining was performed and the skin was advanced and closed in layers with interrupted buried Monocryl sutures and 5-0 fast gut for the skin.    The nasolabial fold area was anesthetized and a 6 mm punch was utilized.  A single 5-0 fast gut stitch was used to close the skin.  The patient tolerated the procedure well.  There were no complications.

## 2020-07-22 ENCOUNTER — Telehealth: Payer: Self-pay | Admitting: Plastic Surgery

## 2020-07-22 LAB — SURGICAL PATHOLOGY

## 2020-07-22 NOTE — Telephone Encounter (Signed)
Returned patients call, LMVM to verify which site is red and still sore. Is it the excision of nasolabial of the right, or left upper thigh. Any fever, chills, nausea, vomiting, or diarrhea.  Patient is coming in tomorrow to see Catalina Antigua.

## 2020-07-22 NOTE — Telephone Encounter (Signed)
Patient said she had a procedure on Friday and the area is still sore, red and oozing. She isn't sure if it is infected. Please call to advise if she needs to be seen.

## 2020-07-23 ENCOUNTER — Encounter: Payer: Self-pay | Admitting: Surgical

## 2020-07-23 ENCOUNTER — Other Ambulatory Visit: Payer: Self-pay

## 2020-07-23 ENCOUNTER — Ambulatory Visit (INDEPENDENT_AMBULATORY_CARE_PROVIDER_SITE_OTHER): Payer: 59 | Admitting: Surgical

## 2020-07-23 DIAGNOSIS — L989 Disorder of the skin and subcutaneous tissue, unspecified: Secondary | ICD-10-CM

## 2020-07-23 DIAGNOSIS — C44310 Basal cell carcinoma of skin of unspecified parts of face: Secondary | ICD-10-CM

## 2020-07-23 MED ORDER — SULFAMETHOXAZOLE-TRIMETHOPRIM 800-160 MG PO TABS: 1.0000 | ORAL_TABLET | Freq: Two times a day (BID) | ORAL | 0 refills | Status: AC

## 2020-07-23 MED ORDER — SULFAMETHOXAZOLE-TRIMETHOPRIM 800-160 MG PO TABS: 1.0000 | ORAL_TABLET | Freq: Two times a day (BID) | ORAL | 0 refills | Status: DC

## 2020-07-23 NOTE — Progress Notes (Signed)
Patient is a 35 year old female here for follow-up after excision of a right nasolabial fold skin lesion and left upper thigh skin lesion with Dr. Claudia Desanctis on 07/17/2020.  Patient had a punch excision of the right nasolabial fold lesion.  She presents today for evaluation due to concern for redness.  Patient reports she noticed some pus draining from the area yesterday which concerned her.  She reports her left leg incision is doing well.  The skin of the right nasolabial fold showed basal cell carcinoma, carcinoma was focally less than 0.1 cm from the edge of the biopsy.  The left thigh lesion with a scar with focal ulceration and hyperkeratosis.  No malignancy identified.  On exam right cheek excision site with some surrounding erythema.  Fast gut sutures noted.  Some surrounding erythema, appears irritated, however she reports pus draining from the area yesterday.  No active drainage noted today.  The area is slightly tender to palpation.  No cellulitic changes.  Left thigh incision intact, sutures in place.  Slight irritation noted, however no incisional dehiscence or cellulitic changes noted.  Discussed pathology results with patient, discussed with her that reexcision may be necessary given the margin was only 1 mm from the edge of the biopsy.  I discussed with her that I would further consult with Dr. Claudia Desanctis for his recommendations and update the patient.  Referral to dermatology for ongoing routine maintenance/skin checks.  I discussed with her that I would send her in a prescription for antibiotic for a few days to decrease risk of infection to the right nasolabial fold incision.  I did discuss with her that I believe it appears more irritated than infected, however given the location would like to treat prophylactically.  Patient is in agreement with this.

## 2020-08-07 ENCOUNTER — Other Ambulatory Visit: Payer: Self-pay

## 2020-08-07 ENCOUNTER — Ambulatory Visit (INDEPENDENT_AMBULATORY_CARE_PROVIDER_SITE_OTHER): Payer: 59 | Admitting: Plastic Surgery

## 2020-08-07 ENCOUNTER — Encounter: Payer: Self-pay | Admitting: Plastic Surgery

## 2020-08-07 VITALS — BP 121/83 | HR 79

## 2020-08-07 DIAGNOSIS — C44310 Basal cell carcinoma of skin of unspecified parts of face: Secondary | ICD-10-CM | POA: Diagnosis not present

## 2020-08-07 NOTE — Progress Notes (Signed)
Patient presents for follow-up after excision of a benign lesion from her left thigh and a punch biopsy in her right nasolabial fold which showed basal cell carcinoma.  I recommended excision and she wanted to discuss that.  On exam she looks to be well-healed in both areas.  There is a little bit of erythema and firmness in her right nasolabial fold that appears to be consistent with routine healing.  There is no signs of infection.  I drew out what I would anticipate as the planned excision if I were to do it today.  She is somewhat hesitant on moving forward and would prefer to wait on treatment of this for now.  She has some special events coming up and she did not want to have a larger scar on her face for those events that are in the next 4 to 6 weeks.  She also has an appointment with a dermatologist coming up in the next few weeks.  I explained the 2 options would be direct excision or Mohs surgery.  I explained the pros and cons of each.  She is going to think about this and get back to [place].  I explained that she did have a cancer diagnosis in the right nasolabial fold and that my recommendation would be to have it excised.  I explained that if she let it go away it might get bigger and cause a larger problem.  She seems understanding of this and is in tending to have it addressed soon.

## 2020-08-09 ENCOUNTER — Ambulatory Visit: Payer: 59 | Admitting: Surgical

## 2020-08-09 NOTE — Progress Notes (Deleted)
Patient is a 35 year old female here for follow-up after reexcision of basal cell carcinoma of her right cheek.

## 2020-08-12 ENCOUNTER — Ambulatory Visit: Payer: 59 | Admitting: Plastic Surgery

## 2021-03-03 ENCOUNTER — Ambulatory Visit: Payer: 59 | Admitting: Plastic Surgery

## 2021-04-01 ENCOUNTER — Ambulatory Visit: Payer: 59 | Admitting: Plastic Surgery
